# Patient Record
Sex: Male | Born: 1990 | Race: Black or African American | Hispanic: No | Marital: Single | Smoking: Never smoker
Health system: Southern US, Community
[De-identification: ages and names within clinical notes are randomized; demographics above are authoritative.]

## PROBLEM LIST (undated history)

## (undated) DIAGNOSIS — N289 Disorder of kidney and ureter, unspecified: Secondary | ICD-10-CM

---

## 2020-02-04 ENCOUNTER — Emergency Department (HOSPITAL_COMMUNITY)
Admission: EM | Admit: 2020-02-04 | Discharge: 2020-02-04 | Disposition: A | Discharge status: Home / Self Care | Attending: Emergency Medicine | Admitting: Emergency Medicine

## 2020-02-04 ENCOUNTER — Emergency Department (HOSPITAL_COMMUNITY)

## 2020-02-04 ENCOUNTER — Other Ambulatory Visit: Payer: Self-pay

## 2020-02-04 ENCOUNTER — Encounter (HOSPITAL_COMMUNITY): Payer: Self-pay

## 2020-02-04 DIAGNOSIS — R1013 Epigastric pain: Secondary | ICD-10-CM | POA: Insufficient documentation

## 2020-02-04 DIAGNOSIS — K8020 Calculus of gallbladder without cholecystitis without obstruction: Secondary | ICD-10-CM | POA: Diagnosis not present

## 2020-02-04 DIAGNOSIS — R079 Chest pain, unspecified: Secondary | ICD-10-CM | POA: Diagnosis present

## 2020-02-04 DIAGNOSIS — Z20822 Contact with and (suspected) exposure to covid-19: Secondary | ICD-10-CM | POA: Diagnosis not present

## 2020-02-04 DIAGNOSIS — R590 Localized enlarged lymph nodes: Secondary | ICD-10-CM | POA: Diagnosis not present

## 2020-02-04 HISTORY — DX: Disorder of kidney and ureter, unspecified: N28.9

## 2020-02-04 LAB — CBC WITH DIFFERENTIAL/PLATELET
Abs Immature Granulocytes: 0.01 10*3/uL (ref 0.00–0.07)
Basophils Absolute: 0.1 10*3/uL (ref 0.0–0.1)
Basophils Relative: 2 %
Eosinophils Absolute: 0.1 10*3/uL (ref 0.0–0.5)
Eosinophils Relative: 2 %
HCT: 54.7 % — ABNORMAL HIGH (ref 39.0–52.0)
Hemoglobin: 18.9 g/dL — ABNORMAL HIGH (ref 13.0–17.0)
Immature Granulocytes: 0 %
Lymphocytes Relative: 23 %
Lymphs Abs: 1.1 10*3/uL (ref 0.7–4.0)
MCH: 29.3 pg (ref 26.0–34.0)
MCHC: 34.6 g/dL (ref 30.0–36.0)
MCV: 84.9 fL (ref 80.0–100.0)
Monocytes Absolute: 0.8 10*3/uL (ref 0.1–1.0)
Monocytes Relative: 16 %
Neutro Abs: 2.7 10*3/uL (ref 1.7–7.7)
Neutrophils Relative %: 57 %
Platelets: 384 10*3/uL (ref 150–400)
RBC: 6.44 MIL/uL — ABNORMAL HIGH (ref 4.22–5.81)
RDW: 12.3 % (ref 11.5–15.5)
WBC: 4.7 10*3/uL (ref 4.0–10.5)
nRBC: 0 % (ref 0.0–0.2)

## 2020-02-04 LAB — URINALYSIS, ROUTINE W REFLEX MICROSCOPIC
Bacteria, UA: NONE SEEN
Glucose, UA: NEGATIVE mg/dL
Hgb urine dipstick: NEGATIVE
Ketones, ur: 20 mg/dL — AB
Leukocytes,Ua: NEGATIVE
Nitrite: NEGATIVE
Protein, ur: 30 mg/dL — AB
Specific Gravity, Urine: 1.029 (ref 1.005–1.030)
pH: 5 (ref 5.0–8.0)

## 2020-02-04 LAB — TROPONIN I (HIGH SENSITIVITY)
Troponin I (High Sensitivity): 5 ng/L (ref <18)
Troponin I (High Sensitivity): 7 ng/L (ref <18)

## 2020-02-04 LAB — COMPREHENSIVE METABOLIC PANEL
ALT: 21 U/L (ref 0–44)
AST: 24 U/L (ref 15–41)
Albumin: 4.8 g/dL (ref 3.5–5.0)
Alkaline Phosphatase: 71 U/L (ref 38–126)
Anion gap: 12 (ref 5–15)
BUN: 12 mg/dL (ref 6–20)
CO2: 23 mmol/L (ref 22–32)
Calcium: 9.7 mg/dL (ref 8.9–10.3)
Chloride: 101 mmol/L (ref 98–111)
Creatinine, Ser: 1.41 mg/dL — ABNORMAL HIGH (ref 0.61–1.24)
GFR calc Af Amer: >60 mL/min (ref >60)
GFR calc non Af Amer: >60 mL/min (ref >60)
Glucose, Bld: 119 mg/dL — ABNORMAL HIGH (ref 70–99)
Potassium: 3.5 mmol/L (ref 3.5–5.1)
Sodium: 136 mmol/L (ref 135–145)
Total Bilirubin: 2.8 mg/dL — ABNORMAL HIGH (ref 0.3–1.2)
Total Protein: 8.3 g/dL — ABNORMAL HIGH (ref 6.5–8.1)

## 2020-02-04 LAB — PROTIME-INR
INR: 1.1 (ref 0.8–1.2)
Prothrombin Time: 14.1 seconds (ref 11.4–15.2)

## 2020-02-04 LAB — APTT: aPTT: 28 seconds (ref 24–36)

## 2020-02-04 LAB — LACTIC ACID, PLASMA
Lactic Acid, Venous: 1.4 mmol/L (ref 0.5–1.9)
Lactic Acid, Venous: 1.1 mmol/L (ref 0.5–1.9)

## 2020-02-04 LAB — SARS CORONAVIRUS 2 BY RT PCR (HOSPITAL ORDER, PERFORMED IN ~~LOC~~ HOSPITAL LAB): SARS Coronavirus 2: NEGATIVE

## 2020-02-04 LAB — LIPASE, BLOOD: Lipase: 22 U/L (ref 11–51)

## 2020-02-04 MED ORDER — SODIUM CHLORIDE 0.9 % IV BOLUS
1000.0000 mL | Freq: Once | INTRAVENOUS | Status: AC
  Administered 2020-02-04: 1000 mL via INTRAVENOUS

## 2020-02-04 MED ORDER — ALUM & MAG HYDROXIDE-SIMETH 200-200-20 MG/5ML PO SUSP
30.0000 mL | Freq: Once | ORAL | Status: AC
  Administered 2020-02-04: 30 mL via ORAL
  Filled 2020-02-04: qty 30

## 2020-02-04 MED ORDER — SODIUM CHLORIDE 0.9 % IV SOLN
2.0000 g | Freq: Once | INTRAVENOUS | Status: AC
  Administered 2020-02-04: 2 g via INTRAVENOUS
  Filled 2020-02-04: qty 20

## 2020-02-04 MED ORDER — LIDOCAINE VISCOUS HCL 2 % MT SOLN
15.0000 mL | Freq: Once | OROMUCOSAL | Status: AC
  Administered 2020-02-04: 15 mL via ORAL
  Filled 2020-02-04: qty 15

## 2020-02-04 MED ORDER — SODIUM CHLORIDE 0.9% FLUSH
3.0000 mL | Freq: Once | INTRAVENOUS | Status: AC
  Administered 2020-02-04: 3 mL via INTRAVENOUS

## 2020-02-04 MED ORDER — METRONIDAZOLE IN NACL 5-0.79 MG/ML-% IV SOLN
500.0000 mg | Freq: Once | INTRAVENOUS | Status: AC
  Administered 2020-02-04: 500 mg via INTRAVENOUS
  Filled 2020-02-04: qty 100

## 2020-02-04 MED ORDER — IOHEXOL 300 MG/ML  SOLN
100.0000 mL | Freq: Once | INTRAMUSCULAR | Status: AC | PRN
  Administered 2020-02-04: 100 mL via INTRAVENOUS

## 2020-02-04 MED ORDER — ACETAMINOPHEN 325 MG PO TABS
650.0000 mg | ORAL_TABLET | Freq: Once | ORAL | Status: AC
  Administered 2020-02-04: 650 mg via ORAL
  Filled 2020-02-04: qty 2

## 2020-02-04 MED ORDER — ONDANSETRON 4 MG PO TBDP
4.0000 mg | ORAL_TABLET | Freq: Three times a day (TID) | ORAL | 0 refills | Status: DC | PRN
Start: 2020-02-04 — End: 2020-02-16

## 2020-02-04 MED ORDER — CIPROFLOXACIN HCL 500 MG PO TABS
500.0000 mg | ORAL_TABLET | Freq: Two times a day (BID) | ORAL | 0 refills | Status: AC
Start: 2020-02-04 — End: 2020-02-09

## 2020-02-04 NOTE — Discharge Instructions (Addendum)
Your ultrasound showed findings consistent with gallstones which is likely causing your pain - please follow up with Dr. Lovell Sheehan general surgeon outpatient in 1-2 weeks.   The CT scan did not show any other concerning findings in your abdomen - it did show some enlargement in the lymphnodes of your chest. It is recommended that you have a repeat CT scan of your chest in 3 months for further evaluation.   I have prescribed antibiotics to cover for an infection - please take as prescribed. I have also prescribed nausea medication to take as needed. Please follow BLAND diet for the next few days including bland toast, white rice, apple sauce, bananas, etc. Things that can irritate your gallbladder include greasy/fatty foods and spicy foods.   Return to the ED for any worsening symptoms

## 2020-02-04 NOTE — ED Notes (Signed)
PATIENT DRANK CUP OF OJ WITHOUT ANY DIFFICULTIES.

## 2020-02-04 NOTE — ED Triage Notes (Signed)
Pt was brought in from [facility] Pt reports epigastric pain at 7am. Described as a burning sensation. No radiation. Sinus tach on monitor. Given nitro x 1 .Descibed as knot and burning sensation. Pt reports that he cant keep food down and hard to swallow.

## 2020-02-04 NOTE — ED Notes (Signed)
Patient completed PO fluid challenge with water and no difficulties.

## 2020-02-04 NOTE — ED Provider Notes (Signed)
[facility] EMERGENCY DEPARTMENT Provider Note   CSN: [id number] Arrival date & time: 02/04/20  6433     History Chief Complaint  Patient presents with  . Chest Pain  . Abdominal Pain    Alexander Foley is a 29 y.o. male who presents to the ED today from [facility] with complaint of gradual onset, constant, sharp/burning, epigastric/substernal chest pain that began this morning around 7 AM. Pt also complains of multiple episodes of NBNB emesis.  Patient states that they went on a hunger strike last week due to not being given commissary privileges.  Did not try to eat anything until yesterday when they felt a "knot" in the epigastrium/substernal area that felt like they would have difficulty swallowing.  They were able to keep the food down however woke up this morning with the same knot feeling and then began vomiting.  Per triage report patient was found to be sinus tachycardia on the monitor and was given 1 nitro without relief.  Pt with no history of CAD.  No family history of CAD.  Patient is a never smoker.  Patient states that he felt subjective fevers earlier this morning, temperature was taken in the ED with reading of 100.2.  Facility is denying Covid outbreaks.  Patient denies any radiation of pain into his jaws, arms, back.  Patient reports mild shortness of breath with the pain. No hx DVT/PE. No recent prolonged travel or immobilization - pt has been at [employer] for 3.5 months now. No exogenous hormone use. No active malignancy. No hemoptysis.   The history is provided by the patient, the EMS personnel and medical records.       Past Medical History:  Diagnosis Date  . Renal disorder    kidney stones    There are no problems to display for this patient.   History reviewed. No pertinent surgical history.     No family history on file.  Social History   Tobacco Use  . Smoking status: Never Smoker  Substance Use Topics  . Alcohol use: Not Currently  .  Drug use: Not Currently    Home Medications Prior to Admission medications   Medication Sig Start Date End Date Taking? Authorizing Provider  ciprofloxacin (CIPRO) 500 MG tablet Take 1 tablet (500 mg total) by mouth 2 (two) times daily for 5 days. 02/04/20 02/09/20  Tanda Rockers, PA-C  ondansetron (ZOFRAN ODT) 4 MG disintegrating tablet Take 1 tablet (4 mg total) by mouth every 8 (eight) hours as needed for nausea or vomiting. 02/04/20   Tanda Rockers, PA-C    Allergies    [place] allergy]  Review of Systems   Review of Systems  Constitutional: Positive for fever. Negative for chills.  Respiratory: Positive for shortness of breath. Negative for cough.   Cardiovascular: Positive for chest pain. Negative for palpitations and leg swelling.  Gastrointestinal: Positive for abdominal pain, nausea and vomiting. Negative for constipation and diarrhea.  All other systems reviewed and are negative.   Physical Exam Updated Vital Signs BP (!) 139/92 (BP Location: Right Arm)   Pulse 100   Temp 100.2 F (37.9 C) (Oral)   Resp 18   Ht 5\' 9"  (1.753 m)   Wt 87.5 kg   SpO2 98%   BMI 28.50 kg/m   Physical Exam Vitals and nursing note reviewed.  Constitutional:      Appearance: He is not ill-appearing or diaphoretic.  HENT:     Head: Normocephalic and atraumatic.  Eyes:     Conjunctiva/sclera: Conjunctivae normal.  Cardiovascular:     Rate and Rhythm: Regular rhythm. Tachycardia present.     Pulses:          Radial pulses are 2+ on the right side and 2+ on the left side.       Dorsalis pedis pulses are 2+ on the right side and 2+ on the left side.     Heart sounds: Normal heart sounds.  Pulmonary:     Effort: Pulmonary effort is normal.     Breath sounds: Normal breath sounds. No decreased breath sounds, wheezing, rhonchi or rales.  Chest:     Chest wall: Tenderness present.  Abdominal:     Palpations: Abdomen is soft.     Tenderness: There is abdominal tenderness. There is  no guarding or rebound.     Comments: Soft, + RUQ/LUQ/epigastric abdominal TTP, +BS throughout, no r/g/r, neg murphy's, neg mcburney's, no CVA TTP  Musculoskeletal:     Cervical back: Neck supple.     Right lower leg: No edema.     Left lower leg: No edema.  Skin:    General: Skin is warm and dry.  Neurological:     Mental Status: He is alert.     ED Results / Procedures / Treatments   Labs (all labs ordered are listed, but only abnormal results are displayed) Labs Reviewed  COMPREHENSIVE METABOLIC PANEL - Abnormal; Notable for the following components:      Result Value   Glucose, Bld 119 (*)    Creatinine, Ser 1.41 (*)    Total Protein 8.3 (*)    Total Bilirubin 2.8 (*)    All other components within normal limits  CBC WITH DIFFERENTIAL/PLATELET - Abnormal; Notable for the following components:   RBC 6.44 (*)    Hemoglobin 18.9 (*)    HCT 54.7 (*)    All other components within normal limits  URINALYSIS, ROUTINE W REFLEX MICROSCOPIC - Abnormal; Notable for the following components:   Color, Urine AMBER (*)    Bilirubin Urine SMALL (*)    Ketones, ur 20 (*)    Protein, ur 30 (*)    All other components within normal limits  SARS CORONAVIRUS 2 BY RT PCR (HOSPITAL ORDER, PERFORMED IN [facility])  CULTURE, BLOOD (ROUTINE X 2)  CULTURE, BLOOD (ROUTINE X 2)  URINE CULTURE  LACTIC ACID, PLASMA  LACTIC ACID, PLASMA  APTT  PROTIME-INR  LIPASE, BLOOD  TROPONIN I (HIGH SENSITIVITY)  TROPONIN I (HIGH SENSITIVITY)    EKG EKG Interpretation  Date/Time:  Monday February 04 2020 09:35:51 EDT Ventricular Rate:  94 PR Interval:    QRS Duration: 87 QT Interval:  396 QTC Calculation: 496 R Axis:   169 Text Interpretation: Sinus rhythm Right atrial enlargement Probable RVH w/ secondary repol abnormality Nonspecific T abnormalities, lateral leads Prolonged QT interval No STEMI Confirmed by Alona Bene [phone]) on 02/04/2020 9:56:39 AM   Radiology DG Chest 2  View  Result Date: 02/04/2020 CLINICAL DATA:  Onset epigastric pain and burning at 7 a.m. today. EXAM: CHEST - 2 VIEW COMPARISON:  None. FINDINGS: The heart size and mediastinal contours are within normal limits. The lungs are clear. The visualized skeletal structures are unremarkable. IMPRESSION: Negative chest. Electronically Signed   By: Drusilla Kanner M.D.   On: 02/04/2020 10:46   CT Abdomen Pelvis W Contrast  Result Date: 02/04/2020 CLINICAL DATA:  RIGHT up quadrant pain, nondiagnostic ultrasound EXAM: CT ABDOMEN AND  PELVIS WITH CONTRAST TECHNIQUE: Multidetector CT imaging of the abdomen and pelvis was performed using the standard protocol following bolus administration of intravenous contrast. CONTRAST:  OMNIPAQUE IOHEXOL 300 MG/ML  SOLN COMPARISON:  Ultrasound acquired of the same date. FINDINGS: Lower chest: Incidental imaging of the lung bases with basilar atelectasis. No consolidation. No pleural effusion. Incidental 15 mm RIGHT hilar lymph node measured in short axis. Hepatobiliary: No focal, suspicious hepatic lesion. Small low-density lesions, 1 in the medial segment of the LEFT hepatic lobe 5 mm likely a small cyst, similar lesion in the posterior RIGHT hepatic lobe. Gallbladder is mildly distended without evidence of wall thickening or pericholecystic fluid. No biliary duct dilation. Portal vein is patent in the liver. Pancreas: Pancreas normal without focal lesion or ductal dilation. No peripancreatic inflammation. Spleen: Spleen normal size without focal lesion. Adrenals/Urinary Tract: Adrenal glands are normal. Kidneys enhance symmetrically.  No sign of hydronephrosis. Stomach/Bowel: . No small bowel dilation. Appendix is normal. Colon normal in course and caliber. Vascular/Lymphatic: No aortic atherosclerosis. No aneurysmal dilation of the abdominal aorta. Reproductive: No pelvic adenopathy. Prostate unremarkable by CT on limited assessment. Other: Small fat containing umbilical  hernia. Musculoskeletal: No acute bone finding. No destructive bone process. IMPRESSION: 1. No acute findings in the abdomen or pelvis. 2. Small fat containing umbilical hernia. 3. RIGHT hilar lymph node with mild enlargement, nonspecific measuring approximately 15 mm short axis, chest is incompletely imaged. Consider short interval follow-up chest CT within 3 months to assess for stability and or other sites of nodal enlargement Electronically Signed   By: Donzetta Kohut M.D.   On: 02/04/2020 14:13   US Abdomen Limited RUQ  Result Date: 02/04/2020 CLINICAL DATA:  Epigastric pain for 1 day EXAM: ULTRASOUND ABDOMEN LIMITED RIGHT UPPER QUADRANT COMPARISON:  Renal sonogram of 11/29/2019 FINDINGS: Gallbladder: Sludge in the gallbladder. Small calculi are noted, largest 4 mm. No reported tenderness over the gallbladder. No wall thickening or pericholecystic fluid. Common bile duct: Diameter: 2.2 mm, limited assessment due to body habitus. Liver: No focal lesion identified. Within normal limits in parenchymal echogenicity. Portal vein is patent on color Doppler imaging with normal direction of blood flow towards the liver. Other: None. IMPRESSION: Sludge with small biliary calculus in the gallbladder lumen, no sonographic signs of acute cholecystitis. Electronically Signed   By: Donzetta Kohut M.D.   On: 02/04/2020 11:41    Procedures Procedures (including critical care time)  Medications Ordered in ED Medications  sodium chloride flush (NS) 0.9 % injection 3 mL (3 mLs Intravenous Given 02/04/20 1032)  acetaminophen (TYLENOL) tablet 650 mg (650 mg Oral Given 02/04/20 1032)  sodium chloride 0.9 % bolus 1,000 mL (0 mLs Intravenous Stopped 02/04/20 1158)  cefTRIAXone (ROCEPHIN) 2 g in sodium chloride 0.9 % 100 mL IVPB (0 g Intravenous Stopped 02/04/20 1128)  metroNIDAZOLE (FLAGYL) IVPB 500 mg (0 mg Intravenous Stopped 02/04/20 1319)  alum & mag hydroxide-simeth (MAALOX/MYLANTA) 200-200-20 MG/5ML suspension 30 mL  (30 mLs Oral Given 02/04/20 1328)    And  lidocaine (XYLOCAINE) 2 % viscous mouth solution 15 mL (15 mLs Oral Given 02/04/20 1328)  iohexol (OMNIPAQUE) 300 MG/ML solution 100 mL (100 mLs Intravenous Contrast Given 02/04/20 1340)    ED Course  I have reviewed the triage vital signs and the nursing notes.  Pertinent labs & imaging results that were available during my care of the patient were reviewed by me and considered in my medical decision making (see chart for details).  Clinical Course as  of Feb 04 1535  Mon Feb 04, 2020  1433 Discussed case with Dr. Arnoldo Morale general surgery - recommends Cipro x 5 days and outpatient follow up   [MV]    Clinical Course User Index [MV] Eustaquio Maize, PA-C   MDM Rules/Calculators/A&P                          29 year old male who presents to the ED from [place] with complaint of epigastric/chest pain that began around 7 AM this morning with associated nausea and nonbloody nonbilious emesis.  On arrival to the ED patient is noted to be febrile 100.2.  Reports subjective fevers this morning however was unable to take temperature.  Patient also noted to be mildly tachycardic in the low 100s.  Nontachypneic.  Satting 98% on room air however does state mild shortness of breath.  On exam patient has diffuse upper abdominal tenderness palpation as well as substernal tenderness to palpation.  No lower abdominal tenderness, no rebound or guarding.  No previous abdominal surgeries.  No risk factors for PE.  No family history of CAD or personal history.  Patient is a never smoker.  Given temperature and tachycardia will work-up for sepsis at this time, I suspect source of infection is likely intra-abdominal related given emesis.  Will provide Rocephin and Flagyl empirically.  Will provide liter fluid bolus pending lactic acid.  Will test for Covid even patient lives in congregate setting.   CBC without signs of leukocytosis; wbc at 4.7. Hgb and Hct  18.9/54.7; does appear hemoconcentrated. Fluids running.  CMP with creatinine 1.41 and T bili 2.8. No other LFT abnormalities. In the setting of abdominal pain and elevated T bili RUQ ultrasound has been ordered.  Lipase 22.  Lactic acid 1.1  CXR clear at this time EKG with prolonged Qt and nonspecific T wave abnormalities. Have held on zofran at this time given prolonged QT; pt not actively vomiting in the ED today Troponin of 5; will repeat  RUQ ultrasound with gallstone as well as sludge, likely contributor of pt's symptoms. No sonographic evidence of acute cholecystitis and it does not appear that the labs reflect this either.  COVID test negative and U/A clear of infection - ?if sludge would cause elevated temp. Discussed case with attending physician Dr. Laverta Baltimore who has evaluated patient - will obtain CT A/P for further evaluation given mild elevation in temp today. Will plan to discuss with general surgery afterwards.   CT scan without any acute abdominal findings. Does show R hilar lymph node with recommendations for outpatient CT scan in 3 months. Pt fluid challenged - able to tolerate water without difficulty.   Dr. Arnoldo Morale general surgeon recommends outpatient follow up. Does recommend placing on 500 mg Cipro BID x 5 days to cover for any type of infection. Have discharged with this as well as zofran PRN. Pt instructed to follow up outpatient with Dr. Arnoldo Morale in 1-2 weeks. He is in agreement with plan and stable for discharge home.   This note was prepared using Dragon voice recognition software and may include unintentional dictation errors due to the inherent limitations of voice recognition software.  Final Clinical Impression(s) / ED Diagnoses Final diagnoses:  Epigastric pain  Calculus of gallbladder without cholecystitis without obstruction  Hilar lymphadenopathy    Rx / DC Orders ED Discharge Orders         Ordered    ciprofloxacin (CIPRO) 500 MG tablet  2 times daily      Discontinue  Reprint     02/04/20 1534    ondansetron (ZOFRAN ODT) 4 MG disintegrating tablet  Every 8 hours PRN     Discontinue  Reprint     02/04/20 1534           Discharge Instructions     Your ultrasound showed findings consistent with gallstones which is likely causing your pain - please follow up with Dr. Lovell SheehanJenkins general surgeon outpatient in 1-2 weeks.   The CT scan did not show any other concerning findings in your abdomen - it did show some enlargement in the lymphnodes of your chest. It is recommended that you have a repeat CT scan of your chest in 3 months for further evaluation.   I have prescribed antibiotics to cover for an infection - please take as prescribed. I have also prescribed nausea medication to take as needed. Please follow BLAND diet for the next few days including bland toast, white rice, apple sauce, bananas, etc. Things that can irritate your gallbladder include greasy/fatty foods and spicy foods.   Return to the ED for any worsening symptoms       Tanda RockersVenter, Amour Cutrone, Cordelia Poche-C 02/04/20 1537    Long, Arlyss RepressJoshua G, MD 02/05/20 1223

## 2020-02-05 LAB — URINE CULTURE: Culture: NO GROWTH

## 2020-02-09 LAB — CULTURE, BLOOD (ROUTINE X 2)
Culture: NO GROWTH
Culture: NO GROWTH

## 2020-02-13 ENCOUNTER — Other Ambulatory Visit: Payer: Self-pay

## 2020-02-13 ENCOUNTER — Inpatient Hospital Stay (HOSPITAL_COMMUNITY)
Admission: EM | Admit: 2020-02-13 | Discharge: 2020-02-16 | DRG: 419 | Disposition: A | Discharge status: Home / Self Care | Attending: General Surgery | Admitting: General Surgery

## 2020-02-13 ENCOUNTER — Encounter (HOSPITAL_COMMUNITY): Payer: Self-pay

## 2020-02-13 DIAGNOSIS — K8050 Calculus of bile duct without cholangitis or cholecystitis without obstruction: Secondary | ICD-10-CM | POA: Diagnosis not present

## 2020-02-13 DIAGNOSIS — K8010 Calculus of gallbladder with chronic cholecystitis without obstruction: Secondary | ICD-10-CM | POA: Diagnosis not present

## 2020-02-13 DIAGNOSIS — Z87442 Personal history of urinary calculi: Secondary | ICD-10-CM

## 2020-02-13 DIAGNOSIS — Z20822 Contact with and (suspected) exposure to covid-19: Secondary | ICD-10-CM | POA: Diagnosis present

## 2020-02-13 DIAGNOSIS — F419 Anxiety disorder, unspecified: Secondary | ICD-10-CM | POA: Diagnosis present

## 2020-02-13 DIAGNOSIS — K8020 Calculus of gallbladder without cholecystitis without obstruction: Secondary | ICD-10-CM

## 2020-02-13 DIAGNOSIS — R112 Nausea with vomiting, unspecified: Secondary | ICD-10-CM | POA: Diagnosis present

## 2020-02-13 DIAGNOSIS — R1011 Right upper quadrant pain: Secondary | ICD-10-CM

## 2020-02-13 NOTE — ED Triage Notes (Signed)
[facility] with complaints of abdominal pain. States that its his gallbladder. He says he needs it taken out.

## 2020-02-14 ENCOUNTER — Encounter (HOSPITAL_COMMUNITY): Payer: Self-pay | Admitting: Internal Medicine

## 2020-02-14 ENCOUNTER — Inpatient Hospital Stay (HOSPITAL_COMMUNITY): Admitting: Anesthesiology

## 2020-02-14 ENCOUNTER — Ambulatory Visit: Payer: Self-pay | Admitting: General Surgery

## 2020-02-14 ENCOUNTER — Inpatient Hospital Stay (HOSPITAL_COMMUNITY)

## 2020-02-14 ENCOUNTER — Encounter (HOSPITAL_COMMUNITY)
Admission: EM | Disposition: A | Discharge status: Home / Self Care | Payer: Self-pay | Source: Home / Self Care | Attending: General Surgery

## 2020-02-14 DIAGNOSIS — K8050 Calculus of bile duct without cholangitis or cholecystitis without obstruction: Secondary | ICD-10-CM | POA: Diagnosis present

## 2020-02-14 DIAGNOSIS — Z20822 Contact with and (suspected) exposure to covid-19: Secondary | ICD-10-CM | POA: Diagnosis present

## 2020-02-14 DIAGNOSIS — Z87442 Personal history of urinary calculi: Secondary | ICD-10-CM | POA: Diagnosis not present

## 2020-02-14 DIAGNOSIS — R112 Nausea with vomiting, unspecified: Secondary | ICD-10-CM | POA: Diagnosis not present

## 2020-02-14 DIAGNOSIS — K8010 Calculus of gallbladder with chronic cholecystitis without obstruction: Secondary | ICD-10-CM | POA: Diagnosis present

## 2020-02-14 DIAGNOSIS — F419 Anxiety disorder, unspecified: Secondary | ICD-10-CM | POA: Diagnosis present

## 2020-02-14 HISTORY — PX: CHOLECYSTECTOMY: SHX55

## 2020-02-14 LAB — COMPREHENSIVE METABOLIC PANEL
ALT: 22 U/L (ref 0–44)
ALT: 20 U/L (ref 0–44)
AST: 26 U/L (ref 15–41)
AST: 23 U/L (ref 15–41)
Albumin: 4.4 g/dL (ref 3.5–5.0)
Albumin: 3.8 g/dL (ref 3.5–5.0)
Alkaline Phosphatase: 55 U/L (ref 38–126)
Alkaline Phosphatase: 48 U/L (ref 38–126)
Anion gap: 8 (ref 5–15)
Anion gap: 10 (ref 5–15)
BUN: 9 mg/dL (ref 6–20)
BUN: 8 mg/dL (ref 6–20)
CO2: 25 mmol/L (ref 22–32)
CO2: 26 mmol/L (ref 22–32)
Calcium: 9.1 mg/dL (ref 8.9–10.3)
Calcium: 8.8 mg/dL — ABNORMAL LOW (ref 8.9–10.3)
Chloride: 104 mmol/L (ref 98–111)
Chloride: 103 mmol/L (ref 98–111)
Creatinine, Ser: 1.3 mg/dL — ABNORMAL HIGH (ref 0.61–1.24)
Creatinine, Ser: 1.31 mg/dL — ABNORMAL HIGH (ref 0.61–1.24)
GFR calc Af Amer: >60 mL/min (ref >60)
GFR calc Af Amer: >60 mL/min (ref >60)
GFR calc non Af Amer: >60 mL/min (ref >60)
GFR calc non Af Amer: >60 mL/min (ref >60)
Glucose, Bld: 96 mg/dL (ref 70–99)
Glucose, Bld: 91 mg/dL (ref 70–99)
Potassium: 3.4 mmol/L — ABNORMAL LOW (ref 3.5–5.1)
Potassium: 3.6 mmol/L (ref 3.5–5.1)
Sodium: 137 mmol/L (ref 135–145)
Sodium: 139 mmol/L (ref 135–145)
Total Bilirubin: 0.9 mg/dL (ref 0.3–1.2)
Total Bilirubin: 0.7 mg/dL (ref 0.3–1.2)
Total Protein: 7.3 g/dL (ref 6.5–8.1)
Total Protein: 6.5 g/dL (ref 6.5–8.1)

## 2020-02-14 LAB — CBC WITH DIFFERENTIAL/PLATELET
Abs Immature Granulocytes: 0.01 10*3/uL (ref 0.00–0.07)
Basophils Absolute: 0.1 10*3/uL (ref 0.0–0.1)
Basophils Relative: 1 %
Eosinophils Absolute: 0.2 10*3/uL (ref 0.0–0.5)
Eosinophils Relative: 4 %
HCT: 46.2 % (ref 39.0–52.0)
Hemoglobin: 15.7 g/dL (ref 13.0–17.0)
Immature Granulocytes: 0 %
Lymphocytes Relative: 30 %
Lymphs Abs: 1.3 10*3/uL (ref 0.7–4.0)
MCH: 29.1 pg (ref 26.0–34.0)
MCHC: 34 g/dL (ref 30.0–36.0)
MCV: 85.7 fL (ref 80.0–100.0)
Monocytes Absolute: 0.8 10*3/uL (ref 0.1–1.0)
Monocytes Relative: 18 %
Neutro Abs: 2.1 10*3/uL (ref 1.7–7.7)
Neutrophils Relative %: 47 %
Platelets: 370 10*3/uL (ref 150–400)
RBC: 5.39 MIL/uL (ref 4.22–5.81)
RDW: 12.1 % (ref 11.5–15.5)
WBC: 4.5 10*3/uL (ref 4.0–10.5)
nRBC: 0 % (ref 0.0–0.2)

## 2020-02-14 LAB — HIV ANTIBODY (ROUTINE TESTING W REFLEX): HIV Screen 4th Generation wRfx: NONREACTIVE

## 2020-02-14 LAB — CBC
HCT: 42.7 % (ref 39.0–52.0)
Hemoglobin: 14.3 g/dL (ref 13.0–17.0)
MCH: 29.1 pg (ref 26.0–34.0)
MCHC: 33.5 g/dL (ref 30.0–36.0)
MCV: 87 fL (ref 80.0–100.0)
Platelets: 324 10*3/uL (ref 150–400)
RBC: 4.91 MIL/uL (ref 4.22–5.81)
RDW: 12.2 % (ref 11.5–15.5)
WBC: 3.9 10*3/uL — ABNORMAL LOW (ref 4.0–10.5)
nRBC: 0 % (ref 0.0–0.2)

## 2020-02-14 LAB — SARS CORONAVIRUS 2 BY RT PCR (HOSPITAL ORDER, PERFORMED IN ~~LOC~~ HOSPITAL LAB): SARS Coronavirus 2: NEGATIVE

## 2020-02-14 LAB — LIPASE, BLOOD: Lipase: 25 U/L (ref 11–51)

## 2020-02-14 LAB — MRSA PCR SCREENING: MRSA by PCR: NEGATIVE

## 2020-02-14 SURGERY — LAPAROSCOPIC CHOLECYSTECTOMY
Anesthesia: General

## 2020-02-14 MED ORDER — IBUPROFEN 800 MG PO TABS
800.0000 mg | ORAL_TABLET | Freq: Four times a day (QID) | ORAL | Status: DC
  Administered 2020-02-15 – 2020-02-16 (×5): 800 mg via ORAL
  Filled 2020-02-14 (×8): qty 1

## 2020-02-14 MED ORDER — ROCURONIUM BROMIDE 100 MG/10ML IV SOLN
INTRAVENOUS | Status: DC | PRN
  Administered 2020-02-14: 30 mg via INTRAVENOUS

## 2020-02-14 MED ORDER — OXYCODONE HCL 5 MG PO TABS
5.0000 mg | ORAL_TABLET | ORAL | Status: DC | PRN
  Administered 2020-02-14 – 2020-02-15 (×3): 5 mg via ORAL
  Filled 2020-02-14 (×5): qty 1

## 2020-02-14 MED ORDER — ORAL CARE MOUTH RINSE: 15.0000 mL | Freq: Once | OROMUCOSAL | Status: AC

## 2020-02-14 MED ORDER — SODIUM CHLORIDE 0.9 % IV SOLN: 2.0000 g | INTRAVENOUS | Status: DC

## 2020-02-14 MED ORDER — LIDOCAINE HCL (CARDIAC) PF 50 MG/5ML IV SOSY
PREFILLED_SYRINGE | INTRAVENOUS | Status: DC | PRN
  Administered 2020-02-14: 100 mg via INTRAVENOUS

## 2020-02-14 MED ORDER — MIDAZOLAM HCL 2 MG/2ML IJ SOLN
INTRAMUSCULAR | Status: DC | PRN
  Administered 2020-02-14: 2 mg via INTRAVENOUS

## 2020-02-14 MED ORDER — LORAZEPAM 2 MG/ML IJ SOLN: 1.0000 mg | Freq: Four times a day (QID) | INTRAMUSCULAR | Status: DC | PRN

## 2020-02-14 MED ORDER — LACTATED RINGERS IV SOLN: INTRAVENOUS | Status: DC

## 2020-02-14 MED ORDER — CHLORHEXIDINE GLUCONATE 0.12 % MT SOLN
OROMUCOSAL | Status: AC
  Filled 2020-02-14: qty 15

## 2020-02-14 MED ORDER — SODIUM CHLORIDE 0.9 % IV SOLN
INTRAVENOUS | Status: AC
  Filled 2020-02-14: qty 2

## 2020-02-14 MED ORDER — SODIUM CHLORIDE 0.9 % IV SOLN
2.0000 g | INTRAVENOUS | Status: AC
  Administered 2020-02-14: 2 g via INTRAVENOUS
  Filled 2020-02-14: qty 2

## 2020-02-14 MED ORDER — SODIUM CHLORIDE 0.9 % IR SOLN
Status: DC | PRN
  Administered 2020-02-14: 1000 mL

## 2020-02-14 MED ORDER — HEPARIN SODIUM (PORCINE) 5000 UNIT/ML IJ SOLN
5000.0000 [IU] | Freq: Three times a day (TID) | INTRAMUSCULAR | Status: DC
  Administered 2020-02-14 – 2020-02-16 (×5): 5000 [IU] via SUBCUTANEOUS
  Filled 2020-02-14 (×7): qty 1

## 2020-02-14 MED ORDER — ONDANSETRON HCL 4 MG/2ML IJ SOLN: 4.0000 mg | Freq: Once | INTRAMUSCULAR | Status: DC | PRN

## 2020-02-14 MED ORDER — MORPHINE SULFATE (PF) 2 MG/ML IV SOLN
2.0000 mg | INTRAVENOUS | Status: DC | PRN
  Administered 2020-02-14 – 2020-02-16 (×2): 2 mg via INTRAVENOUS
  Filled 2020-02-14 (×2): qty 1

## 2020-02-14 MED ORDER — ACETAMINOPHEN 650 MG RE SUPP: 650.0000 mg | Freq: Four times a day (QID) | RECTAL | Status: DC | PRN

## 2020-02-14 MED ORDER — ONDANSETRON HCL 4 MG/2ML IJ SOLN
INTRAMUSCULAR | Status: DC | PRN
  Administered 2020-02-14: 4 mg via INTRAVENOUS

## 2020-02-14 MED ORDER — BUPIVACAINE HCL (PF) 0.5 % IJ SOLN
INTRAMUSCULAR | Status: AC
  Filled 2020-02-14: qty 30

## 2020-02-14 MED ORDER — SUGAMMADEX SODIUM 500 MG/5ML IV SOLN
INTRAVENOUS | Status: DC | PRN
  Administered 2020-02-14: 200 mg via INTRAVENOUS

## 2020-02-14 MED ORDER — ACETAMINOPHEN 325 MG PO TABS: 650.0000 mg | ORAL_TABLET | Freq: Four times a day (QID) | ORAL | Status: DC | PRN

## 2020-02-14 MED ORDER — ONDANSETRON HCL 4 MG/2ML IJ SOLN
4.0000 mg | Freq: Once | INTRAMUSCULAR | Status: AC
  Administered 2020-02-14: 4 mg via INTRAVENOUS
  Filled 2020-02-14: qty 2

## 2020-02-14 MED ORDER — ROCURONIUM BROMIDE 10 MG/ML (PF) SYRINGE
PREFILLED_SYRINGE | INTRAVENOUS | Status: AC
  Filled 2020-02-14: qty 10

## 2020-02-14 MED ORDER — LIDOCAINE 2% (20 MG/ML) 5 ML SYRINGE
INTRAMUSCULAR | Status: AC
  Filled 2020-02-14: qty 15

## 2020-02-14 MED ORDER — SODIUM CHLORIDE 0.9 % IV SOLN: INTRAVENOUS | Status: DC

## 2020-02-14 MED ORDER — HYDROCODONE-ACETAMINOPHEN 5-325 MG PO TABS
1.0000 | ORAL_TABLET | ORAL | Status: DC | PRN
  Administered 2020-02-14: 2 via ORAL
  Filled 2020-02-14: qty 2

## 2020-02-14 MED ORDER — ONDANSETRON HCL 4 MG/2ML IJ SOLN
4.0000 mg | Freq: Four times a day (QID) | INTRAMUSCULAR | Status: DC | PRN
  Administered 2020-02-14 – 2020-02-16 (×3): 4 mg via INTRAVENOUS
  Filled 2020-02-14 (×3): qty 2

## 2020-02-14 MED ORDER — ACETAMINOPHEN 500 MG PO TABS
1000.0000 mg | ORAL_TABLET | Freq: Four times a day (QID) | ORAL | Status: DC
  Administered 2020-02-14 – 2020-02-16 (×6): 1000 mg via ORAL
  Filled 2020-02-14 (×7): qty 2

## 2020-02-14 MED ORDER — CHLORHEXIDINE GLUCONATE 0.12 % MT SOLN
15.0000 mL | Freq: Once | OROMUCOSAL | Status: AC
  Administered 2020-02-14: 15 mL via OROMUCOSAL

## 2020-02-14 MED ORDER — CHLORHEXIDINE GLUCONATE CLOTH 2 % EX PADS: 6.0000 | MEDICATED_PAD | Freq: Once | CUTANEOUS | Status: DC

## 2020-02-14 MED ORDER — LORAZEPAM 2 MG/ML IJ SOLN
1.0000 mg | Freq: Once | INTRAMUSCULAR | Status: AC
  Administered 2020-02-14: 1 mg via INTRAVENOUS
  Filled 2020-02-14: qty 1

## 2020-02-14 MED ORDER — PROPOFOL 10 MG/ML IV BOLUS
INTRAVENOUS | Status: AC
  Filled 2020-02-14: qty 20

## 2020-02-14 MED ORDER — PANTOPRAZOLE SODIUM 40 MG IV SOLR
40.0000 mg | Freq: Once | INTRAVENOUS | Status: AC
  Administered 2020-02-14: 40 mg via INTRAVENOUS
  Filled 2020-02-14: qty 40

## 2020-02-14 MED ORDER — HYDROMORPHONE HCL 1 MG/ML IJ SOLN
INTRAMUSCULAR | Status: AC
  Filled 2020-02-14: qty 1

## 2020-02-14 MED ORDER — MIDAZOLAM HCL 2 MG/2ML IJ SOLN
INTRAMUSCULAR | Status: AC
  Filled 2020-02-14: qty 2

## 2020-02-14 MED ORDER — SEVOFLURANE IN SOLN
RESPIRATORY_TRACT | Status: AC
  Filled 2020-02-14: qty 500

## 2020-02-14 MED ORDER — KETOROLAC TROMETHAMINE 30 MG/ML IJ SOLN
30.0000 mg | Freq: Once | INTRAMUSCULAR | Status: AC
  Administered 2020-02-14: 30 mg via INTRAVENOUS
  Filled 2020-02-14: qty 1

## 2020-02-14 MED ORDER — FENTANYL CITRATE (PF) 100 MCG/2ML IJ SOLN
INTRAMUSCULAR | Status: DC | PRN
  Administered 2020-02-14 (×2): 50 ug via INTRAVENOUS
  Administered 2020-02-14: 100 ug via INTRAVENOUS
  Administered 2020-02-14: 50 ug via INTRAVENOUS

## 2020-02-14 MED ORDER — SODIUM CHLORIDE 0.9 % IV SOLN
2.0000 g | Freq: Once | INTRAVENOUS | Status: AC
  Administered 2020-02-14: 2 g via INTRAVENOUS
  Filled 2020-02-14: qty 20

## 2020-02-14 MED ORDER — HEMOSTATIC AGENTS (NO CHARGE) OPTIME
TOPICAL | Status: DC | PRN
  Administered 2020-02-14: 1 via TOPICAL

## 2020-02-14 MED ORDER — ONDANSETRON HCL 4 MG PO TABS: 4.0000 mg | ORAL_TABLET | Freq: Four times a day (QID) | ORAL | Status: DC | PRN

## 2020-02-14 MED ORDER — SODIUM CHLORIDE 0.9 % IV BOLUS (SEPSIS)
1000.0000 mL | Freq: Once | INTRAVENOUS | Status: AC
  Administered 2020-02-14: 1000 mL via INTRAVENOUS

## 2020-02-14 MED ORDER — HYDROMORPHONE HCL 1 MG/ML IJ SOLN
0.2500 mg | INTRAMUSCULAR | Status: DC | PRN
  Administered 2020-02-14 (×4): 0.5 mg via INTRAVENOUS
  Filled 2020-02-14 (×2): qty 0.5

## 2020-02-14 MED ORDER — SUCCINYLCHOLINE CHLORIDE 20 MG/ML IJ SOLN
INTRAMUSCULAR | Status: DC | PRN
  Administered 2020-02-14: 120 mg via INTRAVENOUS

## 2020-02-14 MED ORDER — PROPOFOL 10 MG/ML IV BOLUS
INTRAVENOUS | Status: DC | PRN
  Administered 2020-02-14: 200 mg via INTRAVENOUS

## 2020-02-14 MED ORDER — BUPIVACAINE HCL (PF) 0.5 % IJ SOLN
INTRAMUSCULAR | Status: DC | PRN
  Administered 2020-02-14: 20 mL

## 2020-02-14 MED ORDER — FENTANYL CITRATE (PF) 250 MCG/5ML IJ SOLN
INTRAMUSCULAR | Status: AC
  Filled 2020-02-14: qty 5

## 2020-02-14 SURGICAL SUPPLY — 43 items
APPLIER CLIP ROT 10 11.4 M/L (STAPLE) ×3
BAG RETRIEVAL 10 (BASKET) ×1
BAG RETRIEVAL 10MM (BASKET) ×1
BLADE SURG 15 STRL LF DISP TIS (BLADE) ×1 IMPLANT
BLADE SURG 15 STRL SS (BLADE) ×2
CHLORAPREP W/TINT 26 (MISCELLANEOUS) ×3 IMPLANT
CLIP APPLIE ROT 10 11.4 M/L (STAPLE) ×1 IMPLANT
CLOTH BEACON ORANGE TIMEOUT ST (SAFETY) ×3 IMPLANT
COVER LIGHT HANDLE STERIS (MISCELLANEOUS) ×6 IMPLANT
COVER WAND RF STERILE (DRAPES) ×3 IMPLANT
DECANTER SPIKE VIAL GLASS SM (MISCELLANEOUS) ×3 IMPLANT
DERMABOND ADVANCED (GAUZE/BANDAGES/DRESSINGS) ×2
DERMABOND ADVANCED .7 DNX12 (GAUZE/BANDAGES/DRESSINGS) ×1 IMPLANT
ELECT REM PT RETURN 9FT ADLT (ELECTROSURGICAL) ×3
ELECTRODE REM PT RTRN 9FT ADLT (ELECTROSURGICAL) ×1 IMPLANT
GLOVE BIO SURGEON STRL SZ 6.5 (GLOVE) ×2 IMPLANT
GLOVE BIO SURGEONS STRL SZ 6.5 (GLOVE) ×1
GLOVE BIOGEL PI IND STRL 6.5 (GLOVE) ×1 IMPLANT
GLOVE BIOGEL PI IND STRL 7.0 (GLOVE) ×3 IMPLANT
GLOVE BIOGEL PI INDICATOR 6.5 (GLOVE) ×2
GLOVE BIOGEL PI INDICATOR 7.0 (GLOVE) ×6
GOWN STRL REUS W/TWL LRG LVL3 (GOWN DISPOSABLE) ×9 IMPLANT
HEMOSTAT SNOW SURGICEL 2X4 (HEMOSTASIS) ×3 IMPLANT
INST SET LAPROSCOPIC AP (KITS) ×3 IMPLANT
KIT TURNOVER KIT A (KITS) ×3 IMPLANT
MANIFOLD NEPTUNE II (INSTRUMENTS) ×3 IMPLANT
NEEDLE INSUFFLATION 14GA 120MM (NEEDLE) ×3 IMPLANT
NS IRRIG 1000ML POUR BTL (IV SOLUTION) ×3 IMPLANT
PACK LAP CHOLE LZT030E (CUSTOM PROCEDURE TRAY) ×3 IMPLANT
PAD ARMBOARD 7.5X6 YLW CONV (MISCELLANEOUS) ×3 IMPLANT
SET BASIN LINEN APH (SET/KITS/TRAYS/PACK) ×3 IMPLANT
SET TUBE SMOKE EVAC HIGH FLOW (TUBING) ×3 IMPLANT
SLEEVE ENDOPATH XCEL 5M (ENDOMECHANICALS) ×3 IMPLANT
SUT MNCRL AB 4-0 PS2 18 (SUTURE) ×6 IMPLANT
SUT VICRYL 0 UR6 27IN ABS (SUTURE) ×3 IMPLANT
SYS BAG RETRIEVAL 10MM (BASKET) ×1
SYSTEM BAG RETRIEVAL 10MM (BASKET) ×1 IMPLANT
TROCAR ENDO BLADELESS 11MM (ENDOMECHANICALS) ×3 IMPLANT
TROCAR XCEL NON-BLD 5MMX100MML (ENDOMECHANICALS) ×3 IMPLANT
TROCAR XCEL UNIV SLVE 11M 100M (ENDOMECHANICALS) ×3 IMPLANT
TUBE CONNECTING 12'X1/4 (SUCTIONS) ×1
TUBE CONNECTING 12X1/4 (SUCTIONS) ×2 IMPLANT
WARMER LAPAROSCOPE (MISCELLANEOUS) ×3 IMPLANT

## 2020-02-14 NOTE — ED Notes (Signed)
Patient given Ginger ale at this time. 

## 2020-02-14 NOTE — H&P (Signed)
History and Physical    Alexander Foley DPO:[id number] DOB: October 10, 1990 DOA: 02/13/2020  PCP: Patient, No Pcp Per (Confirm with patient/family/NH records and if not entered, this has to be entered at [facility] point of entry) [facility] from: [facility]  I have personally briefly reviewed patient's old medical records in [facility]  Chief Complaint: Abdominal pain  HPI: Alexander Foley is a 29 y.o. male with no history of chronic medical problems presented to ED complaining of severe epigastric and right upper quadrant pain with nausea and vomiting.  Patient states that he was recently diagnosed with gallbladder issues and was discharged home with ciprofloxacin but it did not help and he continues to have severe nausea, vomiting right upper quadrant abdominal pain.  Patient states that abdominal pain is colicky in nature, intermittent, 7 out of 10 on pain scale, relieved with nothing and getting worse with movement and is associated with nausea and vomiting.  Patient denies smoking, alcohol or illicit drug use.  (For level 3, the HPI must include 4+ descriptors: Location, Quality, Severity, Duration, Timing, Context, modifying factors, associated signs/symptoms and/or status of 3+ chronic problems.)  (Please avoid self-populating past medical history here) (The initial 2-3 lines should be focused and good to copy and paste in the HPI section of the daily progress note).  ED Course: On arrival to the ED patient had temperature of 98, blood pressure 131/94, heart rate 98, respiratory rate 16 and oxygen saturation 99% on room air.  Blood work showed WBC 4.5, hemoglobin 15.7, sodium 137, potassium 3.4, BUN 9, creatinine 1.3, blood glucose 96.  Patient is given a bolus of normal saline, started on 2 g of Rocephin, 1 dose of IV Protonix 40 mg and 1 dose of IV Zofran.  ED physician contacted general surgery who advised admission to the hospital and IV antibiotics.  General surgery also recommended ultrasound in the  morning and patient will be evaluated by them in the morning.  Review of Systems: As per HPI otherwise 10 point review of systems negative.  Unacceptable ROS statements: "10 systems reviewed," "Extensive" (without elaboration).  Acceptable ROS statements: "All others negative," "All others reviewed and are negative," and "All others unremarkable," with at LEAST ONE ROS documented Can't double dip - if using for HPI can't use for ROS  Past Medical History:  Diagnosis Date  . Renal disorder    kidney stones    History reviewed. No pertinent surgical history.   reports that he has never smoked. He has never used smokeless tobacco. He reports previous alcohol use. He reports previous drug use.  Allergies  Allergen Reactions  . Tuna [Fish Allergy] Shortness Of Breath and Swelling    History reviewed. No pertinent family history.  Unacceptable: Noncontributory, unremarkable, or negative. Acceptable: (example)Family history negative for heart disease  Prior to Admission medications   Medication Sig Start Date End Date Taking? Authorizing Provider  ondansetron (ZOFRAN ODT) 4 MG disintegrating tablet Take 1 tablet (4 mg total) by mouth every 8 (eight) hours as needed for nausea or vomiting. 02/04/20   Tanda Rockers, PA-C    Physical Exam: Vitals:   02/14/20 0000 02/14/20 0100 02/14/20 0200 02/14/20 0300  BP: 119/77 116/79 119/87 110/77  Pulse: 80 81 86 75  Resp: 17 16 (!) 24 17  Temp:      SpO2: 98% 99% 100% 98%  Weight:      Height:        Constitutional: NAD, calm, comfortable Vitals:   02/14/20 0000  02/14/20 0100 02/14/20 0200 02/14/20 0300  BP: 119/77 116/79 119/87 110/77  Pulse: 80 81 86 75  Resp: 17 16 (!) 24 17  Temp:      SpO2: 98% 99% 100% 98%  Weight:      Height:        General: 29 year old African-American male in no acute distress. Eyes: PERRL, lids and conjunctivae normal ENMT: Mucous membranes are moist. Posterior pharynx clear of any exudate or  lesions.Normal dentition.  Neck: normal, supple, no masses, no thyromegaly Respiratory: clear to auscultation bilaterally, no wheezing, no crackles. Normal respiratory effort. No accessory muscle use.  Cardiovascular: Regular rate and rhythm, no murmurs / rubs / gallops. No extremity edema. 2+ pedal pulses. No carotid bruits.  Abdomen: Soft, nondistended but severely tender on palpation in epigastric region and right upper quadrant region. No hepatosplenomegaly. Bowel sounds positive.  Musculoskeletal: no clubbing / cyanosis. No joint deformity upper and lower extremities. Good ROM, no contractures. Normal muscle tone.  Skin: Multiple tattoos on upper extremities and frontal chest Neurologic: CN 2-12 grossly intact. Sensation intact, DTR normal. Strength 5/5 in all 4.  Psychiatric: Normal judgment and insight. Alert and oriented x 3. Normal mood.   (Anything < 9 systems with 2 bullets each down codes to level 1) (If patient refuses exam can't bill higher level) (Make sure to document decubitus ulcers present on admission -- if possible -- and whether patient has chronic indwelling catheter at time of admission)  Labs on Admission: I have personally reviewed following labs and imaging studies  CBC: Recent Labs  Lab 02/13/20 2356  WBC 4.5  NEUTROABS 2.1  HGB 15.7  HCT 46.2  MCV 85.7  PLT 370   Basic Metabolic Panel: Recent Labs  Lab 02/13/20 2356  NA 137  K 3.4*  CL 104  CO2 25  GLUCOSE 96  BUN 9  CREATININE 1.30*  CALCIUM 9.1   GFR: Estimated Creatinine Clearance: 92.6 mL/min (A) (by C-G formula based on SCr of 1.3 mg/dL (H)). Liver Function Tests: Recent Labs  Lab 02/13/20 2356  AST 26  ALT 22  ALKPHOS 55  BILITOT 0.9  PROT 7.3  ALBUMIN 4.4   Recent Labs  Lab 02/13/20 2356  LIPASE 25   No results for input(s): AMMONIA in the last 168 hours. Coagulation Profile: No results for input(s): INR, PROTIME in the last 168 hours. Cardiac Enzymes: No results for  input(s): CKTOTAL, CKMB, CKMBINDEX, TROPONINI in the last 168 hours. BNP (last 3 results) No results for input(s): PROBNP in the last 8760 hours. HbA1C: No results for input(s): HGBA1C in the last 72 hours. CBG: No results for input(s): GLUCAP in the last 168 hours. Lipid Profile: No results for input(s): CHOL, HDL, LDLCALC, TRIG, CHOLHDL, LDLDIRECT in the last 72 hours. Thyroid Function Tests: No results for input(s): TSH, T4TOTAL, FREET4, T3FREE, THYROIDAB in the last 72 hours. Anemia Panel: No results for input(s): VITAMINB12, FOLATE, FERRITIN, TIBC, IRON, RETICCTPCT in the last 72 hours. Urine analysis:    Component Value Date/Time   COLORURINE AMBER (A) 02/04/2020 1209   APPEARANCEUR CLEAR 02/04/2020 1209   LABSPEC 1.029 02/04/2020 1209   PHURINE 5.0 02/04/2020 1209   GLUCOSEU NEGATIVE 02/04/2020 1209   HGBUR NEGATIVE 02/04/2020 1209   BILIRUBINUR SMALL (A) 02/04/2020 1209   KETONESUR 20 (A) 02/04/2020 1209   PROTEINUR 30 (A) 02/04/2020 1209   NITRITE NEGATIVE 02/04/2020 1209   LEUKOCYTESUR NEGATIVE 02/04/2020 1209    Radiological Exams on Admission: No results found.  Assessment/Plan Principal Problem:   Biliary colic Patient  recently found to have gallbladder stone without evidence of cholecystitis and was discharged home on ciprofloxacin but failed outpatient treatment and presented today with right upper quadrant pain, intractable nausea and vomiting.  Patient given 2 g of IV Rocephin and IV fluids in the ED. Continue 2 mg IV Rocephin daily. IV Zofran every 6 hours as needed for nausea and vomiting Tylenol as needed for mild pain, Norco as needed for moderate pain while IV morphine 2 mg every 4 hours as needed for severe pain ordered. General surgery consulted and they recommended admission to the hospital. Patient will likely have cholecystectomy in the next 24 to 48 hours. Ultrasound in the morning as per general surgery recommendations.  Active  Problems:    Nausea and vomiting IV Zofran every 6 hours as needed for nausea and vomiting.  (please populate well all problems here in Problem List. (For example, if patient is on BP meds at home and you resume or decide to hold them, it is a problem that needs to be her. Same for CAD, COPD, HLD and so on)     DVT prophylaxis: Heparin Code Status: Full code Family Communication: No family member present at the bedside Disposition Plan:  Consults called: General surgery Admission status: Inpatient/telemetry   Thalia Party MD [facility] Pager [phone]   If 7PM-7AM, please contact night-coverage [email or web address] Password [email or web address]  02/14/2020, 4:09 AM

## 2020-02-14 NOTE — Anesthesia Postprocedure Evaluation (Signed)
Anesthesia Post Note  Patient: Alexander Foley  Procedure(s) Performed: LAPAROSCOPIC CHOLECYSTECTOMY (N/A )  Patient location during evaluation: PACU Anesthesia Type: General Level of consciousness: awake and alert and patient cooperative Pain management: satisfactory to patient Vital Signs Assessment: post-procedure vital signs reviewed and stable Respiratory status: spontaneous breathing Cardiovascular status: stable Postop Assessment: no apparent nausea or vomiting Anesthetic complications: no   No complications documented.   Last Vitals:  Vitals:   02/14/20 1230 02/14/20 1245  BP: 125/84 122/83  Pulse: 88 89  Resp: 10 17  Temp:    SpO2: 99% 93%    Last Pain:  Vitals:   02/14/20 1254  TempSrc:   PainSc: Asleep                 Nesreen Albano

## 2020-02-14 NOTE — Anesthesia Procedure Notes (Signed)
Procedure Name: Intubation Date/Time: 02/14/2020 10:53 AM Performed by: Vista Deck, CRNA Pre-anesthesia Checklist: Patient identified, Patient being monitored, Timeout performed, Emergency Drugs available and Suction available Patient Re-evaluated:Patient Re-evaluated prior to induction Oxygen Delivery Method: Circle System Utilized Preoxygenation: Pre-oxygenation with 100% oxygen Induction Type: IV induction Laryngoscope Size: Mac and 3 Grade View: Grade II Tube type: Oral Tube size: 7.5 mm Number of attempts: 1 Airway Equipment and Method: stylet Placement Confirmation: ETT inserted through vocal cords under direct vision,  positive ETCO2 and breath sounds checked- equal and bilateral Secured at: 23 cm Tube secured with: Tape Dental Injury: Teeth and Oropharynx as per pre-operative assessment

## 2020-02-14 NOTE — Progress Notes (Signed)
[facility]  Diet. Scheduled tylenol and ibuprofen, PRN roxicodone and morphine for pain. Stay overnight and hope home in the AM.   Algis Greenhouse, MD [facility] [address] [place], [place] [address] [phone] (office)

## 2020-02-14 NOTE — Op Note (Signed)
Operative Note   Preoperative Diagnosis: Biliary colic with intractable nausea/ vomiting    Postoperative Diagnosis: Same   Procedure(s) Performed: Laparoscopic cholecystectomy   Surgeon: Lillia Abed C. Henreitta Leber, MD   Assistants: No qualified resident was available   Anesthesia: General endotracheal   Anesthesiologist: Windell Norfolk, MD    Specimens: Gallbladder    Estimated Blood Loss: Minimal    Blood Replacement: None    Complications: None    Operative Findings: Gallbladder with distention    Procedure: The patient was taken to the operating room and placed supine. General endotracheal anesthesia was induced. Intravenous antibiotics were administered per protocol. An orogastric tube positioned to decompress the stomach. The abdomen was prepared and draped in the usual sterile fashion.    A supraumbilical incision was made and a Veress technique was utilized to achieve pneumoperitoneum to 15 mmHg with carbon dioxide. A 11 mm optiview port was placed through the supraumbilical region, and a 10 mm 0-degree operative laparoscope was introduced. The area underlying the trocar and Veress needle were inspected and without evidence of injury.  Remaining trocars were placed under direct vision. Two 5 mm ports were placed in the right abdomen, between the anterior axillary and midclavicular line.  A final 11 mm port was placed through the mid-epigastrium, near the falciform ligament.    The gallbladder fundus was elevated cephalad and the infundibulum was retracted to the patient's right. The gallbladder/cystic duct junction was skeletonized. The cystic artery noted in the triangle of Calot and was also skeletonized.  We then continued liberal medial and lateral dissection until the critical view of safety was achieved.    The cystic duct was triply clipped and cystic artery was doubly clipped and both were divided. The gallbladder was then dissected from the liver bed with electrocautery. The  specimen was placed in an Endopouch and was retrieved through the epigastric site.   Final inspection revealed acceptable hemostasis. Surgical SNOW was placed in the gallbladder bed.  Trocars were removed and pneumoperitoneum was released.  The epigastric and umbilical port sites were over the rib and smaller than my finger tip respectively. Skin incisions were closed with 4-0 Monocryl subcuticular sutures and Dermabond. The patient was awakened from anesthesia and extubated without complication.    Algis Greenhouse, MD [facility] [address] [place], [place] [address] [phone] (office)

## 2020-02-14 NOTE — Consult Note (Addendum)
[facility] Consult  Reason for Consult: Biliary colic with intractable nausea/ vomiting, ? Cholecystitis  Referring Physician:  Dr. Sherryll Burger   Chief Complaint    Abdominal Pain      HPI: Alexander Foley is a 29 y.o. male with a recent history of 2-3 weeks of upper abdominal pain with epigastric and RUQ pain that is worsening and some pain on the left that is referring. He has associated nausea and vomiting. He says every time he eats he feels dizzy and throws up. He says that he cannot really keep anything down. He was seen in the ED 6/21 and worked up for possible gallbladder issues and found to have sludge in the gallbladder but no cholecystitis. He was placed on ciprofloxacin after the ED talked with Dr. Lovell Sheehan with plans for follow up in clinic. He had a clinic appt today. He continued to have worsening symptoms and was brought back to the hospital last night. I asked the hospitalist to admit for plans for cholecystectomy.  He denies any NSAID use.   Past Medical History:  Diagnosis Date  . Renal disorder    kidney stones    History reviewed. No pertinent surgical history.  History reviewed. No pertinent family history.  No cardiac disease in the family   Social History   Tobacco Use  . Smoking status: Never Smoker  . Smokeless tobacco: Never Used  Substance Use Topics  . Alcohol use: Not Currently  . Drug use: Not Currently    Medications: I have reviewed the patient's current medications. Current Facility-Administered Medications  Medication Dose Route Frequency Provider Last Rate Last Admin  . 0.9 %  sodium chloride infusion   Intravenous Continuous Maurilio Lovely D, DO 75 mL/hr at 02/14/20 0749 Rate Change at 02/14/20 0749  . [MAR Hold] acetaminophen (TYLENOL) tablet 650 mg  650 mg Oral Q6H PRN Renda Rolls Z, DO       Or  . Mitzi Hansen Hold] acetaminophen (TYLENOL) suppository 650 mg  650 mg Rectal Q6H PRN Renda Rolls Z, DO      . cefoTEtan (CEFOTAN) 2 g in  sodium chloride 0.9 % 100 mL IVPB  2 g Intravenous On Call to OR Lucretia Roers, MD      . Mitzi Hansen Hold] cefTRIAXone (ROCEPHIN) 2 g in sodium chloride 0.9 % 100 mL IVPB  2 g Intravenous Q24H Renda Rolls Z, DO      . Chlorhexidine Gluconate Cloth 2 % PADS 6 each  6 each Topical Once Lucretia Roers, MD      . [facility] Hold] heparin injection 5,000 Units  5,000 Units Subcutaneous Q8H Renda Rolls Z, DO   5,000 Units at 02/14/20 1740  . [MAR Hold] HYDROcodone-acetaminophen (NORCO/VICODIN) 5-325 MG per tablet 1-2 tablet  1-2 tablet Oral Q4H PRN Renda Rolls Z, DO      . [MAR Hold] morphine 2 MG/ML injection 2 mg  2 mg Intravenous Q4H PRN Renda Rolls Z, DO      . [MAR Hold] ondansetron [facility]) tablet 4 mg  4 mg Oral Q6H PRN Renda Rolls Z, DO       Or  . Mitzi Hansen Hold] ondansetron [facility]) injection 4 mg  4 mg Intravenous Q6H PRN Renda Rolls Z, DO        Allergies  Allergen Reactions  . Tuna [Fish Allergy] Shortness Of Breath and Swelling     ROS:  A comprehensive review of systems was negative except for: Gastrointestinal: positive for abdominal pain, nausea and  vomiting  Blood pressure 126/83, pulse 97, temperature 98.1 F (36.7 C), temperature source Oral, resp. rate 19, height 5\' 9"  (1.753 m), weight 87.5 kg, SpO2 100 %. Physical Exam Vitals reviewed.  Constitutional:      Appearance: He is normal weight.  HENT:     Head: Normocephalic and atraumatic.  Eyes:     Extraocular Movements: Extraocular movements intact.  Cardiovascular:     Rate and Rhythm: Normal rate and regular rhythm.  Pulmonary:     Effort: Pulmonary effort is normal.     Breath sounds: Normal breath sounds.  Abdominal:     General: There is no distension.     Palpations: Abdomen is soft.     Tenderness: There is abdominal tenderness in the right upper quadrant and epigastric area.  Musculoskeletal:     Comments: Moves all extremities   Skin:    General: Skin is warm and dry.  Neurological:      General: No focal deficit present.     Mental Status: He is alert and oriented to person, place, and time.  Psychiatric:        Mood and Affect: Mood normal.        Behavior: Behavior normal.     Results: Results for orders placed or performed during the hospital encounter of 02/13/20 (from the past 48 hour(s))  CBC with Differential/Platelet     Status: None   Collection Time: 02/13/20 11:56 PM  Result Value Ref Range   WBC 4.5 4.0 - 10.5 K/uL   RBC 5.39 4.22 - 5.81 MIL/uL   Hemoglobin 15.7 13.0 - 17.0 g/dL   HCT 16.146.2 39 - 52 %   MCV 85.7 80.0 - 100.0 fL   MCH 29.1 26.0 - 34.0 pg   MCHC 34.0 30.0 - 36.0 g/dL   RDW 09.612.1 04.511.5 - 40.915.5 %   Platelets 370 150 - 400 K/uL   nRBC 0.0 0.0 - 0.2 %   Neutrophils Relative % 47 %   Neutro Abs 2.1 1.7 - 7.7 K/uL   Lymphocytes Relative 30 %   Lymphs Abs 1.3 0.7 - 4.0 K/uL   Monocytes Relative 18 %   Monocytes Absolute 0.8 0 - 1 K/uL   Eosinophils Relative 4 %   Eosinophils Absolute 0.2 0 - 0 K/uL   Basophils Relative 1 %   Basophils Absolute 0.1 0 - 0 K/uL   Immature Granulocytes 0 %   Abs Immature Granulocytes 0.01 0.00 - 0.07 K/uL    Comment: Performed at [facility], [address] [place], [place] [address]  Comprehensive metabolic panel     Status: Abnormal   Collection Time: 02/13/20 11:56 PM  Result Value Ref Range   Sodium 137 135 - 145 mmol/L   Potassium 3.4 (L) 3.5 - 5.1 mmol/L   Chloride 104 98 - 111 mmol/L   CO2 25 22 - 32 mmol/L   Glucose, Bld 96 70 - 99 mg/dL    Comment: Glucose reference range applies only to samples taken after fasting for at least 8 hours.   BUN 9 6 - 20 mg/dL   Creatinine, Ser 4.781.30 (H) 0.61 - 1.24 mg/dL   Calcium 9.1 8.9 - 29.510.3 mg/dL   Total Protein 7.3 6.5 - 8.1 g/dL   Albumin 4.4 3.5 - 5.0 g/dL   AST 26 15 - 41 U/L   ALT 22 0 - 44 U/L   Alkaline Phosphatase 55 38 - 126 U/L   Total Bilirubin 0.9 0.3 - 1.2  mg/dL   GFR calc non Af Amer >60 >60 mL/min   GFR calc Af Amer >60 >60 mL/min   Anion  gap 8 5 - 15    Comment: Performed at [facility], [address] [place], [place] [address]  Lipase, blood     Status: None   Collection Time: 02/13/20 11:56 PM  Result Value Ref Range   Lipase 25 11 - 51 U/L    Comment: Performed at [facility], [address], [place], [place] [address]  HIV Antibody (routine testing w rflx)     Status: None   Collection Time: 02/13/20 11:56 PM  Result Value Ref Range   HIV Screen 4th Generation wRfx Non Reactive Non Reactive    Comment: Performed at [facility], [address], [place], [place] [address]  SARS Coronavirus 2 by RT PCR (hospital order, performed in [facility] hospital lab) Nasopharyngeal Nasopharyngeal Swab     Status: None   Collection Time: 02/14/20  1:55 AM   Specimen: Nasopharyngeal Swab  Result Value Ref Range   SARS Coronavirus 2 NEGATIVE NEGATIVE    Comment: (NOTE) SARS-CoV-2 target nucleic acids are NOT DETECTED.  The SARS-CoV-2 RNA is generally detectable in upper and lower respiratory specimens during the acute phase of infection. The lowest concentration of SARS-CoV-2 viral copies this assay can detect is 250 copies / mL. A negative result does not preclude SARS-CoV-2 infection and should not be used as the sole basis for treatment or other patient management decisions.  A negative result may occur with improper specimen collection / handling, submission of specimen other than nasopharyngeal swab, presence of viral mutation(s) within the areas targeted by this assay, and inadequate number of viral copies (<250 copies / mL). A negative result must be combined with clinical observations, patient history, and epidemiological information.  Fact Sheet for Patients:   BoilerBrush.com.cy  Fact Sheet for Healthcare Providers: https://pope.com/  This test is not yet approved or  cleared by the [place] FDA and has been authorized for detection and/or diagnosis of  SARS-CoV-2 by FDA under an Emergency Use Authorization (EUA).  This EUA will remain in effect (meaning this test can be used) for the duration of the COVID-19 declaration under Section 564(b)(1) of the Act, 21 U.S.C. section 360bbb-3(b)(1), unless the authorization is terminated or revoked sooner.  Performed at [facility], [address], [place], [place] [address]   MRSA PCR Screening     Status: None   Collection Time: 02/14/20  6:00 AM   Specimen: Nasopharyngeal  Result Value Ref Range   MRSA by PCR NEGATIVE NEGATIVE    Comment:        The GeneXpert MRSA Assay (FDA approved for NASAL specimens only), is one component of a comprehensive MRSA colonization surveillance program. It is not intended to diagnose MRSA infection nor to guide or monitor treatment for MRSA infections. Performed at [facility], [address] [place], [place] [address]   CBC     Status: Abnormal   Collection Time: 02/14/20  6:23 AM  Result Value Ref Range   WBC 3.9 (L) 4.0 - 10.5 K/uL   RBC 4.91 4.22 - 5.81 MIL/uL   Hemoglobin 14.3 13.0 - 17.0 g/dL   HCT 13.0 39 - 52 %   MCV 87.0 80.0 - 100.0 fL   MCH 29.1 26.0 - 34.0 pg   MCHC 33.5 30.0 - 36.0 g/dL   RDW 86.5 78.4 - 69.6 %   Platelets 324 150 -  400 K/uL   nRBC 0.0 0.0 - 0.2 %    Comment: Performed at [facility], [address] [place], [place] [address]  Comprehensive metabolic panel     Status: Abnormal   Collection Time: 02/14/20  6:23 AM  Result Value Ref Range   Sodium 139 135 - 145 mmol/L   Potassium 3.6 3.5 - 5.1 mmol/L   Chloride 103 98 - 111 mmol/L   CO2 26 22 - 32 mmol/L   Glucose, Bld 91 70 - 99 mg/dL    Comment: Glucose reference range applies only to samples taken after fasting for at least 8 hours.   BUN 8 6 - 20 mg/dL   Creatinine, Ser 8.56 (H) 0.61 - 1.24 mg/dL   Calcium 8.8 (L) 8.9 - 10.3 mg/dL   Total Protein 6.5 6.5 - 8.1 g/dL   Albumin 3.8 3.5 - 5.0 g/dL   AST 23 15 - 41 U/L   ALT 20 0 - 44 U/L   Alkaline  Phosphatase 48 38 - 126 U/L   Total Bilirubin 0.7 0.3 - 1.2 mg/dL   GFR calc non Af Amer >60 >60 mL/min   GFR calc Af Amer >60 >60 mL/min   Anion gap 10 5 - 15    Comment: Performed at [facility], [address] [place], [place] [address]    Personally reviewed imaging- [place] with stone / sludge and distended, no wall thickening, CBD normal, CT with distended gallbladder, no other abdominal process, ? Hilar node that needs follow up CT chest in 3 months   Assessment & Plan:  Alexander Foley is a 29 y.o. male with biliary colic with intractable nausea and vomiting but potentially some degree of cholecystitis that has been masked by his antibiotic use.   -PLAN: I counseled the patient about the indication, risks and benefits of laparoscopic cholecystectomy.  He understands there is a very small chance for bleeding, infection, injury to normal structures (including common bile duct), conversion to open surgery, persistent symptoms, evolution of postcholecystectomy diarrhea, need for secondary interventions, anesthesia reaction, cardiopulmonary issues and other risks not specifically detailed here. I described the expected recovery, the plan for follow-up and the restrictions during the recovery phase.  All questions were answered.  COVID negative   All questions were answered to the satisfaction of the patient.    Lucretia Roers 02/14/2020, 10:29 AM

## 2020-02-14 NOTE — Anesthesia Preprocedure Evaluation (Signed)
Anesthesia Evaluation  Patient identified by MRN, date of birth, ID band Patient awake    Reviewed: Allergy & Precautions, H&P , NPO status , Patient's Chart, lab work & pertinent test results, reviewed documented beta blocker date and time   Airway Mallampati: II  TM Distance: >3 FB Neck ROM: full    Dental no notable dental hx.    Pulmonary neg pulmonary ROS,    Pulmonary exam normal breath sounds clear to auscultation       Cardiovascular Exercise Tolerance: Good negative cardio ROS   Rhythm:regular Rate:Normal     Neuro/Psych negative neurological ROS  negative psych ROS   GI/Hepatic negative GI ROS, Neg liver ROS,   Endo/Other  negative endocrine ROS  Renal/GU negative Renal ROS  negative genitourinary   Musculoskeletal   Abdominal   Peds  Hematology negative hematology ROS (+)   Anesthesia Other Findings   Reproductive/Obstetrics negative OB ROS                             Anesthesia Physical Anesthesia Plan  ASA: II  Anesthesia Plan: General   Post-op Pain Management:    Induction:   PONV Risk Score and Plan: 2  Airway Management Planned:   Additional Equipment:   Intra-op Plan:   Post-operative Plan:   Informed Consent: I have reviewed the patients History and Physical, chart, labs and discussed the procedure including the risks, benefits and alternatives for the proposed anesthesia with the patient or authorized representative who has indicated his/her understanding and acceptance.     Dental Advisory Given  Plan Discussed with: CRNA  Anesthesia Plan Comments:         Anesthesia Quick Evaluation

## 2020-02-14 NOTE — Plan of Care (Signed)

## 2020-02-14 NOTE — Transfer of Care (Signed)
Immediate Anesthesia Transfer of Care Note  Patient: Alexander Foley  Procedure(s) Performed: LAPAROSCOPIC CHOLECYSTECTOMY (N/A )  Patient Location: PACU  Anesthesia Type:General  Level of Consciousness: awake and patient cooperative  Airway & Oxygen Therapy: Patient Spontanous Breathing and non-rebreather face mask  Post-op Assessment: Report given to RN and Post -op Vital signs reviewed and stable  Post vital signs: Reviewed and stable98.5  Last Vitals:  Vitals Value Taken Time  BP 129/89 02/14/20 1203  Temp 98.5   Pulse 95 02/14/20 1204  Resp 17 02/14/20 1204  SpO2 100 % 02/14/20 1204  Vitals shown include unvalidated device data.  Last Pain:  Vitals:   02/14/20 1023  TempSrc: Oral  PainSc: 6       Patients Stated Pain Goal: 9 (02/14/20 1023)  Complications: No complications documented.

## 2020-02-14 NOTE — ED Provider Notes (Signed)
[facility] EMERGENCY DEPARTMENT Provider Note   CSN: [id number] Arrival date & time: 02/13/20  2145     History Chief Complaint  Patient presents with  . Abdominal Pain    Alexander Foley is a 29 y.o. male.  The history is provided by the patient.  Abdominal Pain Pain location:  Epigastric and RUQ Pain radiates to:  Does not radiate Pain severity:  Moderate Onset quality:  Gradual Duration:  10 days Timing:  Intermittent Progression:  Worsening Chronicity:  Recurrent Relieved by:  Nothing Worsened by:  Movement and palpation Associated symptoms: diarrhea, nausea, shortness of breath and vomiting   Associated symptoms: no fever and no hematochezia   Patient presents for recurrent abdominal pain and vomiting Patient reports he was recently diagnosed with gallbladder issues and since that time he has had very little p.o. intake.  He reports most foods make him nauseous and he vomits.  He also reports continued upper abdominal pain.  Tonight he reports while at the prison his vomiting worsened and he felt short of breath.  No new chest pain     Past Medical History:  Diagnosis Date  . Renal disorder    kidney stones    There are no problems to display for this patient.   History reviewed. No pertinent surgical history.     History reviewed. No pertinent family history.  Social History   Tobacco Use  . Smoking status: Never Smoker  . Smokeless tobacco: Never Used  Substance Use Topics  . Alcohol use: Not Currently  . Drug use: Not Currently    Home Medications Prior to Admission medications   Medication Sig Start Date End Date Taking? Authorizing Provider  ondansetron (ZOFRAN ODT) 4 MG disintegrating tablet Take 1 tablet (4 mg total) by mouth every 8 (eight) hours as needed for nausea or vomiting. 02/04/20   Tanda Rockers, PA-C    Allergies    [place] allergy]  Review of Systems   Review of Systems  Constitutional: Negative for fever.  Respiratory:  Positive for shortness of breath.   Gastrointestinal: Positive for abdominal pain, diarrhea, nausea and vomiting. Negative for hematochezia.  All other systems reviewed and are negative.   Physical Exam Updated Vital Signs BP 116/79   Pulse 81   Temp 98 F (36.7 C)   Resp 16   Ht 1.753 m (5\' 9" )   Wt 87.5 kg   SpO2 99%   BMI 28.50 kg/m   Physical Exam CONSTITUTIONAL: Well developed/well nourished HEAD: Normocephalic/atraumatic EYES: EOMI/PERRL, no icterus ENMT: Mucous membranes moist NECK: supple no meningeal signs SPINE/BACK:entire spine nontender CV: S1/S2 noted, no murmurs/rubs/gallops noted LUNGS: Lungs are clear to auscultation bilaterally, no apparent distress ABDOMEN: soft, moderate RUQ/epigastric tenderness, no rebound or guarding, bowel sounds noted throughout abdomen GU:no cva tenderness NEURO: Pt is awake/alert/appropriate, moves all extremitiesx4.  No facial droop.  EXTREMITIES: pulses normal/equal, full ROM, hands are in cuffs SKIN: warm, color normal PSYCH: no abnormalities of mood noted, alert and oriented to situation  ED Results / Procedures / Treatments   Labs (all labs ordered are listed, but only abnormal results are displayed) Labs Reviewed  COMPREHENSIVE METABOLIC PANEL - Abnormal; Notable for the following components:      Result Value   Potassium 3.4 (*)    Creatinine, Ser 1.30 (*)    All other components within normal limits  SARS CORONAVIRUS 2 BY RT PCR (HOSPITAL ORDER, PERFORMED IN [facility])  CBC WITH DIFFERENTIAL/PLATELET  LIPASE, BLOOD  EKG EKG Interpretation  Date/Time:  Wednesday February 13 2020 23:49:30 EDT Ventricular Rate:  85 PR Interval:    QRS Duration: 86 QT Interval:  374 QTC Calculation: 445 R Axis:   -4 Text Interpretation: Sinus rhythm Borderline T abnormalities, inferior leads No significant change since last tracing Interpretation limited secondary to artifact Confirmed by Zadie Rhine ([id number]) on  02/13/2020 11:58:54 PM   Radiology No results found.  Procedures Procedures Medications Ordered in ED Medications  cefTRIAXone (ROCEPHIN) 2 g in sodium chloride 0.9 % 100 mL IVPB (2 g Intravenous New Bag/Given 02/14/20 0201)  ondansetron (ZOFRAN) injection 4 mg (4 mg Intravenous Given 02/14/20 0119)  sodium chloride 0.9 % bolus 1,000 mL (0 mLs Intravenous Stopped 02/14/20 0155)  pantoprazole (PROTONIX) injection 40 mg (40 mg Intravenous Given 02/14/20 0122)    ED Course  I have reviewed the triage vital signs and the nursing notes.  Pertinent labs  results that were available during my care of the patient were reviewed by me and considered in my medical decision making (see chart for details).    MDM Rules/Calculators/A&P                           This patient presents to the ED for concern of abdominal pain, this involves an extensive number of treatment options, and is a complaint that carries with it a high risk of complications and morbidity.  The differential diagnosis includes peptic ulcer disease, pancreatitis, cholecystitis, appendicitis   Lab Tests:   I Ordered, reviewed, and interpreted labs, which included lipase, liver function test, electrolytes, complete blood count  Medicines ordered:   I ordered medication Zofran, Protonix, Rocephin for nausea and cholelithiasis   Additional history obtained:    Previous records obtained and reviewed   Consultations Obtained:   I consulted general surgery Dr. Henreitta Leber and discussed lab  findings  She recommends medical admission.  Right upper quadrant ultrasound in the morning.  Start IV antibiotics.  Dr. Lovell Sheehan will need to be re-consulted  Discussed with Dr. Welton Flakes with [facility] hospitalist for admission  Reevaluation:  After the interventions stated above, I reevaluated the patient and found patient is stable  Patient found to have cholelithiasis on June 21 and started on Cipro at the direction of Dr. Lovell Sheehan.  Patient  is currently a prisoner [place] a local prison [place].  He reports continued abdominal pain and vomiting with most meals.  Patient appears to be failing outpatient management.  After discussion with general surgery he will be admitted to the hospital for likely cholecystectomy in the next 24-48 hours  No other signs of acute abdominal emergency this time.  Patient was updated on plan. Final Clinical Impression(s) / ED Diagnoses Final diagnoses:  Biliary colic  Calculus of gallbladder without cholecystitis without obstruction    Rx / DC Orders ED Discharge Orders    None       Zadie Rhine, MD 02/14/20 0222

## 2020-02-14 NOTE — Progress Notes (Signed)
Per HPI: Alexander Foley is a 29 y.o. male with no history of chronic medical problems presented to ED complaining of severe epigastric and right upper quadrant pain with nausea and vomiting.  Patient states that he was recently diagnosed with gallbladder issues and was discharged home with ciprofloxacin but it did not help and he continues to have severe nausea, vomiting right upper quadrant abdominal pain.  Patient states that abdominal pain is colicky in nature, intermittent, 7 out of 10 on pain scale, relieved with nothing and getting worse with movement and is associated with nausea and vomiting.  Patient denies smoking, alcohol or illicit drug use.   -Patient was seen and evaluated with guards at bedside.  He complained of minimal ongoing abdominal pain particularly to the right upper quadrant this AM.  He stated that he has worsening symptoms of abdominal pain as well as nausea and vomiting particularly with eating.  He was admitted with biliary colic and has now undergone laparoscopic cholecystectomy with no complications noted.  He does not have any other significant medical history.  Plan will be to transfer patient to general surgery service at this time.  Hospitalist service to sign off.  Please consult as needed.  Total care time: 20 minutes.

## 2020-02-15 ENCOUNTER — Inpatient Hospital Stay (HOSPITAL_COMMUNITY)

## 2020-02-15 ENCOUNTER — Encounter (HOSPITAL_COMMUNITY): Payer: Self-pay | Admitting: General Surgery

## 2020-02-15 LAB — SURGICAL PATHOLOGY

## 2020-02-15 MED ORDER — JUVEN PO PACK
1.0000 | PACK | Freq: Two times a day (BID) | ORAL | Status: DC
  Administered 2020-02-16: 1 via ORAL
  Filled 2020-02-15: qty 1

## 2020-02-15 MED ORDER — ENSURE MAX PROTEIN PO LIQD
11.0000 [oz_av] | Freq: Every day | ORAL | Status: DC
  Administered 2020-02-16: 11 [oz_av] via ORAL

## 2020-02-15 NOTE — Plan of Care (Signed)

## 2020-02-15 NOTE — Progress Notes (Signed)
Initial Nutrition Assessment  DOCUMENTATION CODES:      INTERVENTION:  Ensure Max po daily, each supplement provides 150 kcal and 30 grams of protein  Juven BID, each packet provides 95 calories, 2.5 grams of protein (collagen), and 9.8 grams of carbohydrate (3 grams sugar); also contains 7 grams of L-arginine and L-glutamine, 300 mg vitamin C, 15 mg vitamin E, 1.2 mcg vitamin B-12, 9.5 mg zinc, 200 mg calcium, and 1.5 g  Calcium Beta-hydroxy-Beta-methylbutyrate to support post-op wound healing  NUTRITION DIAGNOSIS:   Increased nutrient needs related to acute illness, post-op healing (biliary colic s/p laparoscopic cholecystectomy) as evidenced by estimated needs.    GOAL:   Patient will meet greater than or equal to 90% of their needs    MONITOR:   Labs, I & O's, Diet advancement, TF tolerance, Weight trends, PO intake  REASON FOR ASSESSMENT:   Malnutrition Screening Tool    ASSESSMENT:  RD working remotely.  29 year male with no past chronic medical history presented with complaints of severe epigastric and right upper quadrant pain with nausea and vomiting admitted for biliary colic.  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 Diet advanced to soft at 1125 today, per flowsheets he consumed 75% of lunch meal. Will provide Ensure Max to aid with meeting needs as well as Juven to support post-op wound healing.   No weight history available for review I/Os: +2448 ml since admit   +1315 ml x 24 hrs UOP: 700 ml x 24 hrs Medications reviewed and include: Tylenol, Ibuprofen Lactated ringers @ 75 ml/hr Labs reviewed  NUTRITION - FOCUSED PHYSICAL EXAM: Unable to complete at this time, RD working remotely.  Diet Order:   Diet Order            DIET SOFT Room service appropriate? Yes; Fluid consistency: Thin  Diet  effective now                 EDUCATION NEEDS:      Skin:  Skin Assessment: Skin Integrity Issues: Skin Integrity Issues:: Incisions Incisions: closed;abdomen  Last BM:  7/01  Height:   Ht Readings from Last 1 Encounters:  02/14/20 5\' 9"  (1.753 m)    Weight:   Wt Readings from Last 1 Encounters:  02/14/20 87.5 kg    Ideal Body Weight:  72.7 kg  BMI:  Body mass index is 28.49 kg/m.  Estimated Nutritional Needs:   Kcal:  2200-2400  Protein:  110-120  Fluid:  >/= 2.2 L/day   04/16/20, RD, LDN Clinical Nutrition After Hours/Weekend Pager # in Amion

## 2020-02-15 NOTE — Plan of Care (Signed)

## 2020-02-15 NOTE — Progress Notes (Signed)
[facility] Progress Note  1 Day Post-Op  Subjective: Doing fair but had significant nausea last night. Was finally able to keep down liquids today. Xray without ileus. Has started some solid foods. Sore.  Objective: Vital signs in last 24 hours: Temp:  [97.8 F (36.6 C)-98.4 F (36.9 C)] 98.4 F (36.9 C) (07/02 0625) Pulse Rate:  [78-90] 84 (07/02 0625) Resp:  [20] 20 (07/02 0625) BP: (116-135)/(82-91) 135/91 (07/02 0625) SpO2:  [99 %-100 %] 100 % (07/02 0625) Last BM Date: 02/14/20  Intake/Output from previous day: 07/01 0701 - 07/02 0700 In: 2025.9 [P.O.:350; I.V.:1575.9; IV Piggyback:100] Out: 710 [Urine:700; Blood:10] Intake/Output this shift: Total I/O In: 240 [P.O.:240] Out: -   General appearance: alert, cooperative and no distress Resp: normal work of breathing incisions with dermabond, no erythema or drainage  Lab Results:  Recent Labs    02/13/20 2356 02/14/20 0623  WBC 4.5 3.9*  HGB 15.7 14.3  HCT 46.2 42.7  PLT 370 324   BMET Recent Labs    02/13/20 2356 02/14/20 0623  NA 137 139  K 3.4* 3.6  CL 104 103  CO2 25 26  GLUCOSE 96 91  BUN 9 8  CREATININE 1.30* 1.31*  CALCIUM 9.1 8.8*    Studies/Results: DG Abd 1 View  Result Date: 02/15/2020 CLINICAL DATA:  Status post cholecystectomy.  Nausea, vomiting EXAM: ABDOMEN - 1 VIEW COMPARISON:  CT 02/04/2020 FINDINGS: The subdiaphragmatic region is excluded from view. Visualized abdominal gas pattern is unremarkable. Cholecystectomy clips noted within the right upper quadrant. No gross free intraperitoneal gas. No organomegaly. No abnormal intra-abdominal calcification. Osseous structures are unremarkable. IMPRESSION: Normal abdominal gas pattern Electronically Signed   By: Helyn Numbers MD   On: 02/15/2020 09:49    Anti-infectives: Anti-infectives (From admission, onward)   Start     Dose/Rate Route Frequency Ordered Stop   02/14/20 2200  cefTRIAXone (ROCEPHIN) 2 g in sodium  chloride 0.9 % 100 mL IVPB  Status:  Discontinued       Note to Pharmacy: Patient had first dose in ED. Pharmacy to adjust timing for next dose.   2 g 200 mL/hr over 30 Minutes Intravenous Every 24 hours 02/14/20 0349 02/14/20 1440   02/14/20 0945  cefoTEtan (CEFOTAN) 2 g in sodium chloride 0.9 % 100 mL IVPB        2 g 200 mL/hr over 30 Minutes Intravenous On call to O.R. 02/14/20 0941 02/14/20 1106   02/14/20 0200  cefTRIAXone (ROCEPHIN) 2 g in sodium chloride 0.9 % 100 mL IVPB        2 g 200 mL/hr over 30 Minutes Intravenous  Once 02/14/20 0154 02/14/20 0321      Assessment/Plan: Mr. Brislin is a 29 yo POD 1 s/p laparoscopic cholecystectomy for chronic cholecystitis. Doing better but just has started to tolerating diet. -tylenol and ibuprofen for pain, PRN narcotic -IS, OOB -Soft diet -D/c tomorrow  -Will need to call [phone] Ex [phone] to check on patient after d/c    LOS: 1 day    Lucretia Roers 02/15/2020

## 2020-02-16 LAB — COMPREHENSIVE METABOLIC PANEL
ALT: 33 U/L (ref 0–44)
AST: 33 U/L (ref 15–41)
Albumin: 3.5 g/dL (ref 3.5–5.0)
Alkaline Phosphatase: 47 U/L (ref 38–126)
Anion gap: 10 (ref 5–15)
BUN: 5 mg/dL — ABNORMAL LOW (ref 6–20)
CO2: 26 mmol/L (ref 22–32)
Calcium: 8.8 mg/dL — ABNORMAL LOW (ref 8.9–10.3)
Chloride: 101 mmol/L (ref 98–111)
Creatinine, Ser: 1.2 mg/dL (ref 0.61–1.24)
GFR calc Af Amer: >60 mL/min (ref >60)
GFR calc non Af Amer: >60 mL/min (ref >60)
Glucose, Bld: 91 mg/dL (ref 70–99)
Potassium: 3.5 mmol/L (ref 3.5–5.1)
Sodium: 137 mmol/L (ref 135–145)
Total Bilirubin: 1 mg/dL (ref 0.3–1.2)
Total Protein: 6.1 g/dL — ABNORMAL LOW (ref 6.5–8.1)

## 2020-02-16 LAB — CBC WITH DIFFERENTIAL/PLATELET
Abs Immature Granulocytes: 0.03 10*3/uL (ref 0.00–0.07)
Basophils Absolute: 0 10*3/uL (ref 0.0–0.1)
Basophils Relative: 1 %
Eosinophils Absolute: 0.1 10*3/uL (ref 0.0–0.5)
Eosinophils Relative: 2 %
HCT: 40.8 % (ref 39.0–52.0)
Hemoglobin: 13.7 g/dL (ref 13.0–17.0)
Immature Granulocytes: 0 %
Lymphocytes Relative: 9 %
Lymphs Abs: 0.7 10*3/uL (ref 0.7–4.0)
MCH: 29.3 pg (ref 26.0–34.0)
MCHC: 33.6 g/dL (ref 30.0–36.0)
MCV: 87.4 fL (ref 80.0–100.0)
Monocytes Absolute: 1.1 10*3/uL — ABNORMAL HIGH (ref 0.1–1.0)
Monocytes Relative: 14 %
Neutro Abs: 5.6 10*3/uL (ref 1.7–7.7)
Neutrophils Relative %: 74 %
Platelets: 284 10*3/uL (ref 150–400)
RBC: 4.67 MIL/uL (ref 4.22–5.81)
RDW: 12.4 % (ref 11.5–15.5)
WBC: 7.5 10*3/uL (ref 4.0–10.5)
nRBC: 0 % (ref 0.0–0.2)

## 2020-02-16 MED ORDER — PROMETHAZINE HCL 25 MG/ML IJ SOLN
12.5000 mg | Freq: Four times a day (QID) | INTRAMUSCULAR | Status: DC | PRN
  Administered 2020-02-16: 12.5 mg via INTRAVENOUS
  Filled 2020-02-16: qty 1

## 2020-02-16 MED ORDER — DOCUSATE SODIUM 100 MG PO CAPS: 100.0000 mg | ORAL_CAPSULE | Freq: Two times a day (BID) | ORAL | 0 refills | Status: AC | PRN

## 2020-02-16 MED ORDER — ONDANSETRON 4 MG PO TBDP: 4.0000 mg | ORAL_TABLET | Freq: Three times a day (TID) | ORAL | 0 refills | Status: AC | PRN

## 2020-02-16 MED ORDER — OXYCODONE HCL 5 MG PO TABS: 5.0000 mg | ORAL_TABLET | ORAL | 0 refills | Status: AC | PRN

## 2020-02-16 MED ORDER — LORAZEPAM 2 MG/ML IJ SOLN
1.0000 mg | Freq: Four times a day (QID) | INTRAMUSCULAR | Status: DC | PRN
  Administered 2020-02-16: 1 mg via INTRAVENOUS
  Filled 2020-02-16: qty 1

## 2020-02-16 NOTE — Progress Notes (Signed)
Nsg Discharge Note  Admit Date:  02/13/2020 Discharge date: 02/16/2020   Alexander Foley to be D/C'd to facility per MD order.  AVS completed. Patient/caregiver able to verbalize understanding.  Discharge Medication: Allergies as of 02/16/2020      Reactions   Tuna [fish Allergy] Shortness Of Breath, Swelling      Medication List    TAKE these medications   docusate sodium 100 MG capsule Commonly known as: Colace Take 1 capsule (100 mg total) by mouth 2 (two) times daily as needed for mild constipation.   ondansetron 4 MG disintegrating tablet Commonly known as: Zofran ODT Take 1 tablet (4 mg total) by mouth every 8 (eight) hours as needed for nausea or vomiting.   oxyCODONE 5 MG immediate release tablet Commonly known as: Oxy IR/ROXICODONE Take 1 tablet (5 mg total) by mouth every 4 (four) hours as needed for severe pain or breakthrough pain.            Discharge Care Instructions  (From admission, onward)         Start     Ordered   02/16/20 0000  No dressing needed       Comments: Ok to shower. Do not pick at the glue over the incisions.   02/16/20 1152          Discharge Assessment: Vitals:   02/16/20 0409 02/16/20 1328  BP: 124/84 127/86  Pulse: 84 85  Resp: 18 20  Temp: 98.6 F (37 C) 98.1 F (36.7 C)  SpO2: 100% 100%   Skin clean, dry and intact without evidence of skin break down, no evidence of skin tears noted. IV catheter discontinued intact. Site without signs and symptoms of complications - no redness or edema noted at insertion site, patient denies c/o pain - only slight tenderness at site.  Dressing with slight pressure applied.  D/c Instructions-Education: Discharge instructions given to patient/family with verbalized understanding. D/c education completed with patient/family including follow up instructions, medication list, d/c activities limitations if indicated, with other d/c instructions as indicated by MD - patient able to verbalize  understanding, all questions fully answered. Patient instructed to return to ED, call 911, or call MD for any changes in condition.  Patient escorted via WC, and [facility].  Kizzie Bane, RN 02/16/2020 2:55 PM

## 2020-02-16 NOTE — Progress Notes (Signed)
[facility]  [facility] as he will need pain medication and no RN at his site for next two days per the guards. Called physician, Dr. Stacie Glaze, at facility [facility] complex.   Notified of surgery. Post op pain needs. Possibly nausea but tolerating diet at discharge.   [phone].  Algis Greenhouse, MD

## 2020-02-16 NOTE — Discharge Summary (Addendum)
Physician Discharge Summary  Patient ID: Alexander Foley MRN: [id number] DOB/AGE: Sep 26, 1990 29 y.o.  Admit date: 02/13/2020 Discharge date: 02/16/2020  Admission Diagnoses: Biliary colic and intractable nausea/ vomiting   Discharge Diagnoses:  Principal Problem:   Biliary colic Active Problems:   Nausea and vomiting  Pathology: Clinical History: cholelithiasis, biliary colic   FINAL MICROSCOPIC DIAGNOSIS:   A. GALLBLADDER, CHOLECYSTECTOMY:  - Chronic cholecystitis.   Discharged Condition: good  Hospital Course: Alexander Foley is a 29 yo who presented to the hospital with intractable abdominal pain and nausea/ vomiting. He had been seen and discharged on ciprofloxacin earlier in the month for possible cholecystitis. He returned not having seen Dr. Lovell Sheehan yet in the clinic. He was taken back for a cholecystectomy and this was uneventful. Post operatively he did have some significant nausea and vomiting and was kept another night due to the inability to tolerate po.   The labs the day of discharge were reassuring. Xray done POD 1 was normal bowel gas pattern.   He still had some nausea but some of this was felt to be possible from anxiety per the RN.  He was tolerating a diet and having pain control prior to discharge. He was ambulating.   Consults: None - Hospitalist admission, Surgery took over   Significant Diagnostic Studies:  Results for Alexander Foley, Alexander Foley (MRN [id number]) as of 02/16/2020 11:54  Ref. Range 02/16/2020 05:58  Sodium Latest Ref Range: 135 - 145 mmol/L 137  Potassium Latest Ref Range: 3.5 - 5.1 mmol/L 3.5  Chloride Latest Ref Range: 98 - 111 mmol/L 101  CO2 Latest Ref Range: 22 - 32 mmol/L 26  Glucose Latest Ref Range: 70 - 99 mg/dL 91  BUN Latest Ref Range: 6 - 20 mg/dL 5 (L)  Creatinine Latest Ref Range: 0.61 - 1.24 mg/dL 6.28  Calcium Latest Ref Range: 8.9 - 10.3 mg/dL 8.8 (L)  Anion gap Latest Ref Range: 5 - 15  10  Alkaline Phosphatase Latest Ref Range: 38 - 126 U/L 47   Albumin Latest Ref Range: 3.5 - 5.0 g/dL 3.5  AST Latest Ref Range: 15 - 41 U/L 33  ALT Latest Ref Range: 0 - 44 U/L 33  Total Protein Latest Ref Range: 6.5 - 8.1 g/dL 6.1 (L)  Total Bilirubin Latest Ref Range: 0.3 - 1.2 mg/dL 1.0  GFR, Est Non African American Latest Ref Range: >60 mL/min >60  GFR, Est African American Latest Ref Range: >60 mL/min >60  WBC Latest Ref Range: 4.0 - 10.5 K/uL 7.5  RBC Latest Ref Range: 4.22 - 5.81 MIL/uL 4.67  Hemoglobin Latest Ref Range: 13.0 - 17.0 g/dL 31.5  HCT Latest Ref Range: 39 - 52 % 40.8  MCV Latest Ref Range: 80.0 - 100.0 fL 87.4  MCH Latest Ref Range: 26.0 - 34.0 pg 29.3  MCHC Latest Ref Range: 30.0 - 36.0 g/dL 17.6  RDW Latest Ref Range: 11.5 - 15.5 % 12.4  Platelets Latest Ref Range: 150 - 400 K/uL 284  nRBC Latest Ref Range: 0.0 - 0.2 % 0.0  Neutrophils Latest Units: % 74  Lymphocytes Latest Units: % 9  Monocytes Relative Latest Units: % 14  Eosinophil Latest Units: % 2  Basophil Latest Units: % 1  Immature Granulocytes Latest Units: % 0  NEUT# Latest Ref Range: 1.7 - 7.7 K/uL 5.6  Lymphocyte # Latest Ref Range: 0.7 - 4.0 K/uL 0.7  Monocyte # Latest Ref Range: 0 - 1 K/uL 1.1 (H)  Eosinophils Absolute Latest Ref Range: 0 -  0 K/uL 0.1  Basophils Absolute Latest Ref Range: 0 - 0 K/uL 0.0  Abs Immature Granulocytes Latest Ref Range: 0.00 - 0.07 K/uL 0.03   KUB Abdomen  CLINICAL DATA:  Status post cholecystectomy.  Nausea, vomiting  EXAM: ABDOMEN - 1 VIEW  COMPARISON:  CT 02/04/2020  FINDINGS: The subdiaphragmatic region is excluded from view. Visualized abdominal gas pattern is unremarkable. Cholecystectomy clips noted within the right upper quadrant. No gross free intraperitoneal gas. No organomegaly. No abnormal intra-abdominal calcification. Osseous structures are unremarkable.  IMPRESSION: Normal abdominal gas pattern   Electronically Signed   By: Helyn Numbers MD   On: 02/15/2020 09:49  Treatments: IV  hydration and surgery: 02/14/2020 Laparoscopic cholecystectomy   Discharge Exam: Blood pressure 124/84, pulse 84, temperature 98.6 F (37 C), temperature source Oral, resp. rate 18, height 5\' 9"  (1.753 m), weight 87.5 kg, SpO2 100 %. General appearance: alert, cooperative and no distress Resp: normal work of breathing GI: soft, nondistended, appropriately tender, port sites c/d/i with dermabond no erythema or drainage Extremities: extremities normal, atraumatic, no cyanosis or edema  Disposition: Discharge disposition: 01-Home or Self Care       Discharge Instructions    Call MD for:  difficulty breathing, headache or visual disturbances   Complete by: As directed    Call MD for:  extreme fatigue   Complete by: As directed    Call MD for:  persistant dizziness or light-headedness   Complete by: As directed    Call MD for:  persistant nausea and vomiting   Complete by: As directed    Call MD for:  redness, tenderness, or signs of infection (pain, swelling, redness, odor or green/yellow discharge around incision site)   Complete by: As directed    Call MD for:  severe uncontrolled pain   Complete by: As directed    Call MD for:  temperature >100.4   Complete by: As directed    Discharge instructions   Complete by: As directed    Please allow patient to walk multiple times a day. Increase activity slowly as tolerated.  Request for an extra mattress post operatively given the soreness from the incisions.   Increase activity slowly   Complete by: As directed    Lifting restrictions   Complete by: As directed    No heavy lifting > 10 lbs, excessive bending, pushing, pulling, or squatting for 1-2 weeks after surgery.   No dressing needed   Complete by: As directed    Ok to shower. Do not pick at the glue over the incisions.     Allergies as of 02/16/2020      Reactions   Tuna [fish Allergy] Shortness Of Breath, Swelling      Medication List    TAKE these medications    docusate sodium 100 MG capsule Commonly known as: Colace Take 1 capsule (100 mg total) by mouth 2 (two) times daily as needed for mild constipation.   ondansetron 4 MG disintegrating tablet Commonly known as: Zofran ODT Take 1 tablet (4 mg total) by mouth every 8 (eight) hours as needed for nausea or vomiting.   oxyCODONE 5 MG immediate release tablet Commonly known as: Oxy IR/ROXICODONE Take 1 tablet (5 mg total) by mouth every 4 (four) hours as needed for severe pain or breakthrough pain.            Discharge Care Instructions  (From admission, onward)         Start     Ordered  02/16/20 0000  No dressing needed       Comments: Ok to shower. Do not pick at the glue over the incisions.   02/16/20 1152          Follow-up Information    Lucretia Roers, MD On 03/04/2020.   Specialty: General Surgery Why: post op phone call from Dr. Henreitta Leber, if he needs to be seen in person call the office Contact information: [address] [place] [facility] [address] [phone]               Signed: Lucretia Roers 02/16/2020, 12:03 PM

## 2020-02-16 NOTE — Discharge Instructions (Signed)
Please allow the patient to walk and be mobile after surgery. Showering is ok. DO not pick at the glue on the incisions.  PLEASE GIVE PATIENT AN ADDITIONAL MATTRESS FOR 2 WEEKS TO HELP WITH SORENESS FROM HIS INCISIONS.   Discharge Laparoscopic Surgery Instructions:  Common Complaints: Right shoulder pain is common after laparoscopic surgery. This is secondary to the gas used in the surgery being trapped under the diaphragm.  Walk to help your body absorb the gas. This will improve in a few days. Pain at the port sites are common, especially the larger port sites. This will improve with time.  Some nausea is common and poor appetite. The main goal is to stay hydrated the first few days after surgery.   Diet/ Activity: Diet as tolerated. You may not have an appetite, but it is important to stay hydrated. Drink 64 ounces of water a day. Your appetite will return with time.  Shower per your regular routine daily.  Do not take hot showers. Take warm showers that are less than 10 minutes. Rest and listen to your body, but do not remain in bed all day.  Walk everyday for at least 15-20 minutes. Deep cough and move around every 1-2 hours in the first few days after surgery.  Do not lift > 10 lbs, perform excessive bending, pushing, pulling, squatting for 1-2 weeks after surgery.  Do not pick at the dermabond glue on your incision sites.  This glue film will remain in place for 1-2 weeks and will start to peel off.  Do not place lotions or balms on your incision unless instructed to specifically by Dr. Henreitta Leber.   Pain Expectations and Narcotics: -After surgery you will have pain associated with your incisions and this is normal. The pain is muscular and nerve pain, and will get better with time. -You are encouraged and expected to take non narcotic medications like tylenol and ibuprofen (when able) to treat pain as multiple modalities can aid with pain treatment. -Narcotics are only used when pain is  severe or there is breakthrough pain. -You are not expected to have a pain score of 0 after surgery, as we cannot prevent pain. A pain score of 3-4 that allows you to be functional, move, walk, and tolerate some activity is the goal. The pain will continue to improve over the days after surgery and is dependent on your surgery. -Due to  law, we are only able to give a certain amount of pain medication to treat post operative pain, and we only give additional narcotics on a patient by patient basis.  -For most laparoscopic surgery, studies have shown that the majority of patients only need 10-15 narcotic pills, and for open surgeries most patients only need 15-20.   -Having appropriate expectations of pain and knowledge of pain management with non narcotics is important as we do not want anyone to become addicted to narcotic pain medication.  -Using ice packs in the first 48 hours and heating pads after 48 hours, wearing an abdominal binder (when recommended), and using over the counter medications are all ways to help with pain management.   -Simple acts like meditation and mindfulness practices after surgery can also help with pain control and research has proven the benefit of these practices.  Medication: Take tylenol and ibuprofen as needed for pain control, alternating every 4-6 hours.  Example:  Tylenol 1000mg  @ 6am, 12noon, 6pm, (Do not exceed 4000mg  of tylenol a day). Ibuprofen 800mg  @ 9am, 3pm,  9pm, 3am (Do not exceed 3600mg  of ibuprofen a day).  Take Roxicodone for breakthrough pain every 4 hours.  Take Colace for constipation related to narcotic pain medication. If you do not have a bowel movement in 2 days, take Miralax over the counter.  Drink plenty of water to also prevent constipation.   Contact Information: If you have questions or concerns, please call our office, [phone], Monday- Thursday 8AM-5PM and Friday 8AM-12Noon.  If it is after hours or on the weekend,  please call [facility], [phone], and ask to speak to the surgeon on call for Dr. [phone] at [facility].    Laparoscopic Cholecystectomy, Care After This sheet gives you information about how to care for yourself after your procedure. Your doctor may also give you more specific instructions. If you have problems or questions, contact your doctor. Follow these instructions at home: Care for cuts from surgery (incisions)   Follow instructions from your doctor about how to take care of your cuts from surgery. Make sure you: ? Wash your hands with soap and water before you change your bandage (dressing). If you cannot use soap and water, use hand sanitizer. ? Change your bandage as told by your doctor. ? Leave stitches (sutures), skin glue, or skin tape (adhesive) strips in place. They may need to stay in place for 2 weeks or longer. If tape strips get loose and curl up, you may trim the loose edges. Do not remove tape strips completely unless your doctor says it is okay.  Do not take baths, swim, or use a hot tub until your doctor says it is okay.   You may shower.  Check your surgical cut area every day for signs of infection. Check for: ? More redness, swelling, or pain. ? More fluid or blood. ? Warmth. ? Pus or a bad smell. Activity  Do not drive or use heavy machinery while taking prescription pain medicine.  Do not lift anything that is heavier than 10 lb (4.5 kg) until your doctor says it is okay.  Do not play contact sports until your doctor says it is okay.  Do not drive for 24 hours if you were given a medicine to help you relax (sedative).  Rest as needed. Do not return to work or school until your doctor says it is okay. General instructions  Take over-the-counter and prescription medicines only as told by your doctor.  To prevent or treat constipation while you are taking prescription pain medicine, your doctor may recommend that you: ? Drink enough fluid  to keep your pee (urine) clear or pale yellow. ? Take over-the-counter or prescription medicines. ? Eat foods that are high in fiber, such as fresh fruits and vegetables, whole grains, and beans. ? Limit foods that are high in fat and processed sugars, such as fried and sweet foods. Contact a doctor if:  You develop a rash.  You have more redness, swelling, or pain around your surgical cuts.  You have more fluid or blood coming from your surgical cuts.  Your surgical cuts feel warm to the touch.  You have pus or a bad smell coming from your surgical cuts.  You have a fever.  One or more of your surgical cuts breaks open. Get help right away if:  You have trouble breathing.  You have chest pain.  You have pain that is getting worse in your shoulders.  You faint or feel dizzy when you stand.  You have very bad pain  in your belly (abdomen).  You are sick to your stomach (nauseous) for more than one day.  You have throwing up (vomiting) that lasts for more than one day.  You have leg pain. This information is not intended to replace advice given to you by your health care provider. Make sure you discuss any questions you have with your health care provider. Document Revised: 07/15/2017 Document Reviewed: 01/19/2016 Elsevier Patient Education  2020 [employer].

## 2020-02-16 NOTE — Progress Notes (Signed)
Attempted to call report to Prison at [phone] however I was informed that MD is required to call MD at the prison facility because patient due to be discharged on Oxycodone 5 mg IR q 4hrs as needed for severe pain or breakthrough pain. Informed by Nurse that current facility [facility] is not equipped with a nurse to administer pain medication to him at this time. Patient would need to be at another facility and the medical provider will make that decision. Dr. Henreitta Leber has been verbally  Informed.

## 2020-02-16 NOTE — Progress Notes (Signed)
   02/16/20 0430  Provider Notification  Provider Name/Title Dr. Henreitta Leber  Date Provider Notified 02/16/20  Time Provider Notified 0430  Notification Type Call  Notification Reason Other (Comment) (pt. is nauseous, vomitted x2, very anxious)  Response See new orders;Other (Comment) (phenergan 12.5, ativan mg 1mg )  Date of Provider Response 02/16/20  Time of Provider Response 0430

## 2020-02-16 NOTE — Progress Notes (Signed)
Report given to [place] at [facility].

## 2020-03-04 ENCOUNTER — Telehealth (INDEPENDENT_AMBULATORY_CARE_PROVIDER_SITE_OTHER): Payer: Self-pay | Admitting: General Surgery

## 2020-03-04 DIAGNOSIS — K8050 Calculus of bile duct without cholangitis or cholecystitis without obstruction: Secondary | ICD-10-CM

## 2020-03-04 NOTE — Progress Notes (Addendum)
[facility]  Post Op Non- Billable Phone Call   I am calling the patient for post operative evaluation due to the current COVID 19 pandemic. He is at [facility]. I called [phone] to see how he was doing.    I spoke with prison officials who reports that he is at the prison. They attempted to transfer me to speak with Dr. Merilynn Finland but the line did not connect. I gave the officials my office number for them to call me back at the office. Someone did call me back but did not wait for me to get to the phone. I called back [phone] and was sent to [employer] and I left a message for them to call the office with questions or concerns regarding the patient. I notified them we can fax the pathology report if needed. Nothing concerning- chronic cholecystitis.   I finally did speak with Dr. Merilynn Finland who reported patient without issues. Faxed pathology report to the prison.   Pathology: FINAL MICROSCOPIC DIAGNOSIS:   A. GALLBLADDER, CHOLECYSTECTOMY:  - Chronic cholecystitis.  Algis Greenhouse, MD [facility] [address] [place]ella Raring [place], [place] [address] [phone] (office)

## 2022-02-22 IMAGING — US US ABDOMEN LIMITED
1 series · 14 of 25 positions shown · non-contrast
Comparison: Renal sonogram of 11/29/2019

CLINICAL DATA: Epigastric pain for 1 day

EXAM:
ULTRASOUND ABDOMEN LIMITED RIGHT UPPER QUADRANT

[Series 1: us abdomen limited · 0.18mm/px · 14 of 75 slices shown]
[im 1/75]
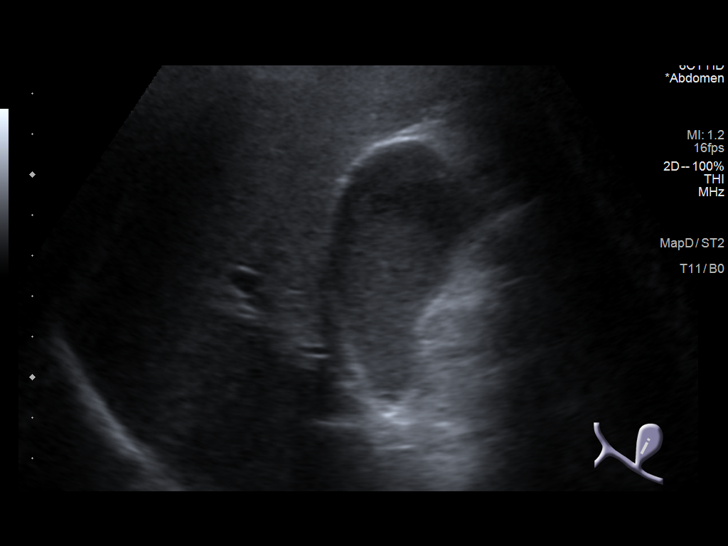
[im 7/75]
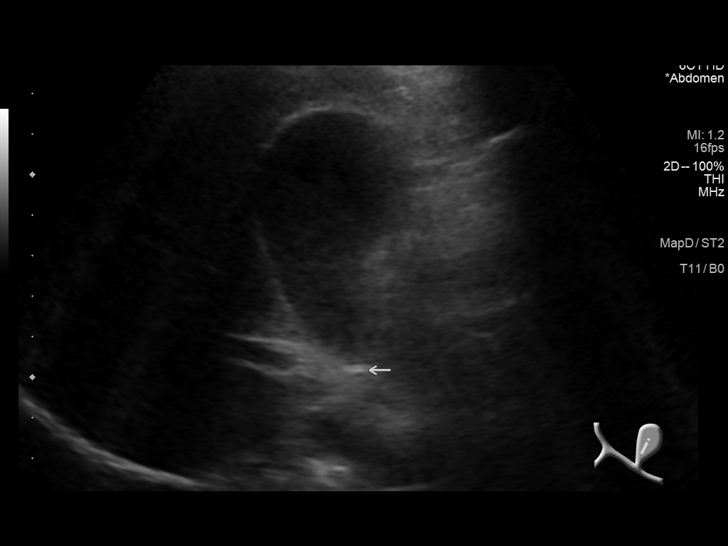
[im 13/75]
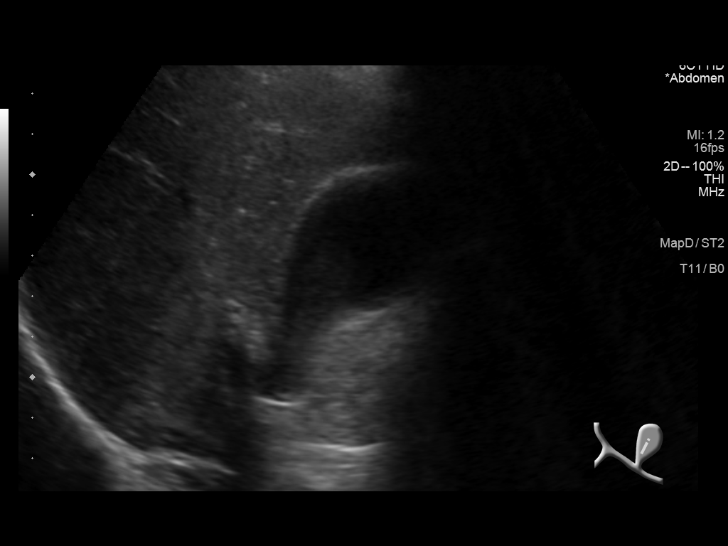
[im 19/75]
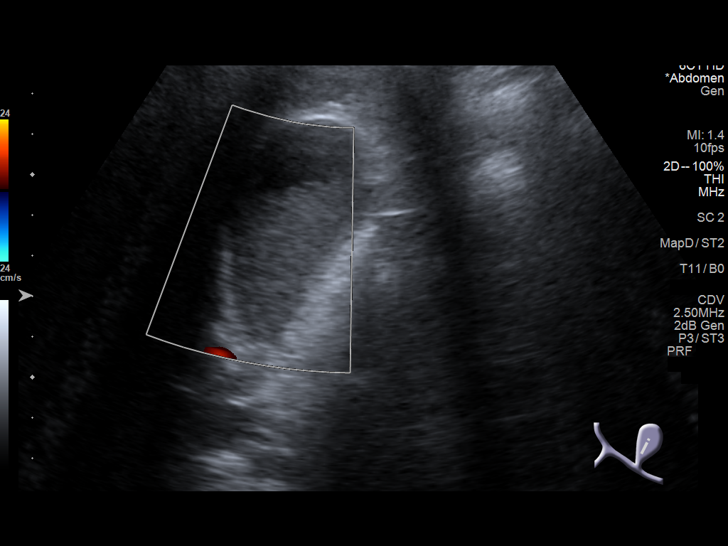
[im 25/75]
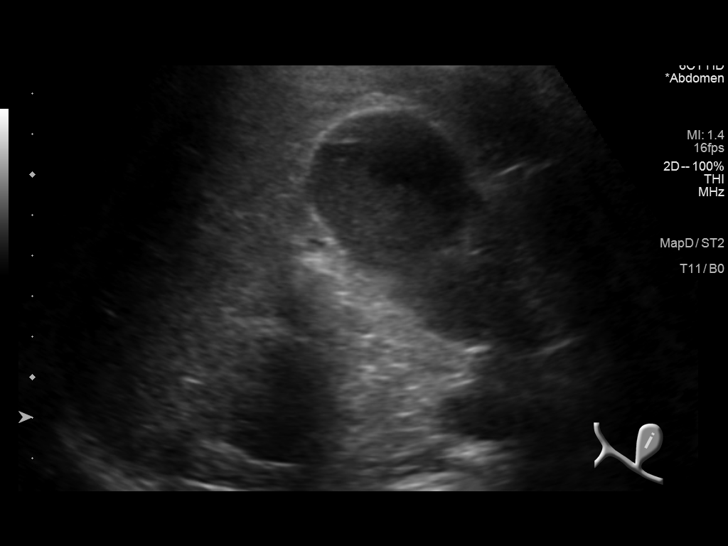
[im 28/75]
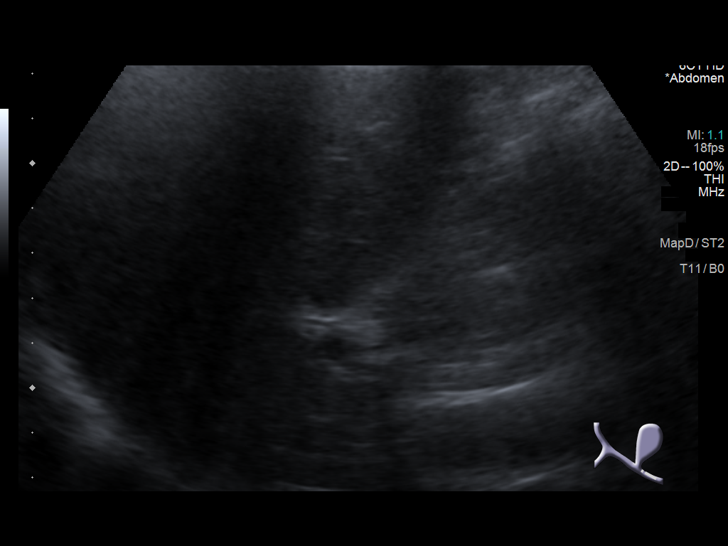
[im 34/75]
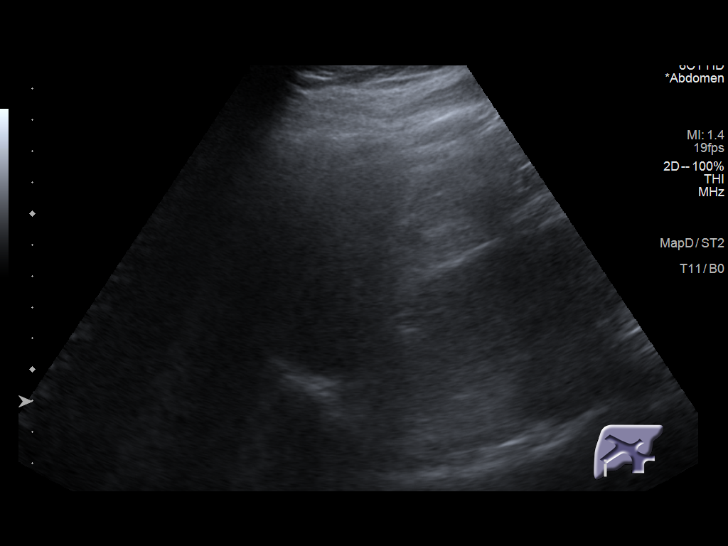
[im 41/75]
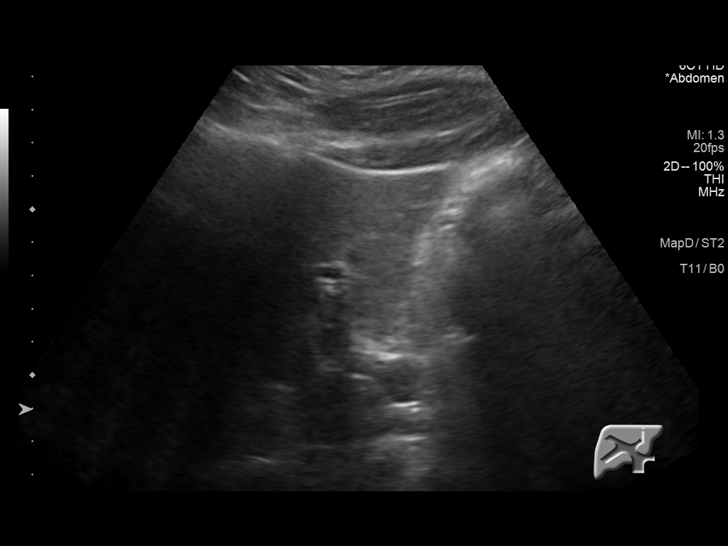
[im 47/75]
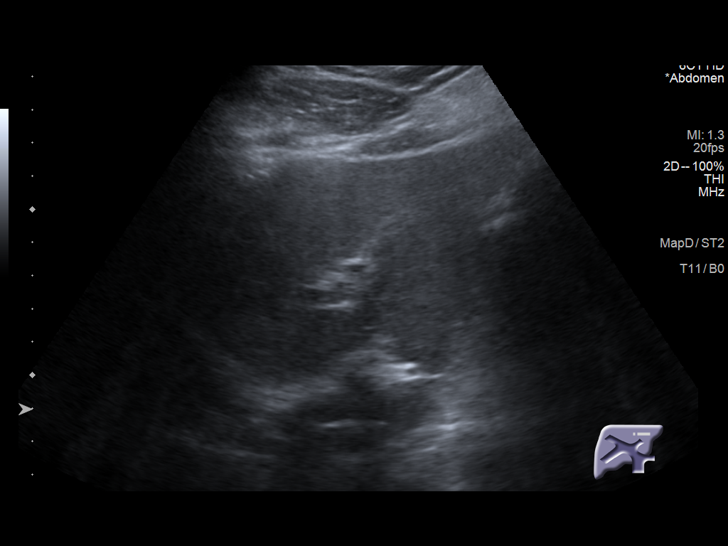
[im 50/75]
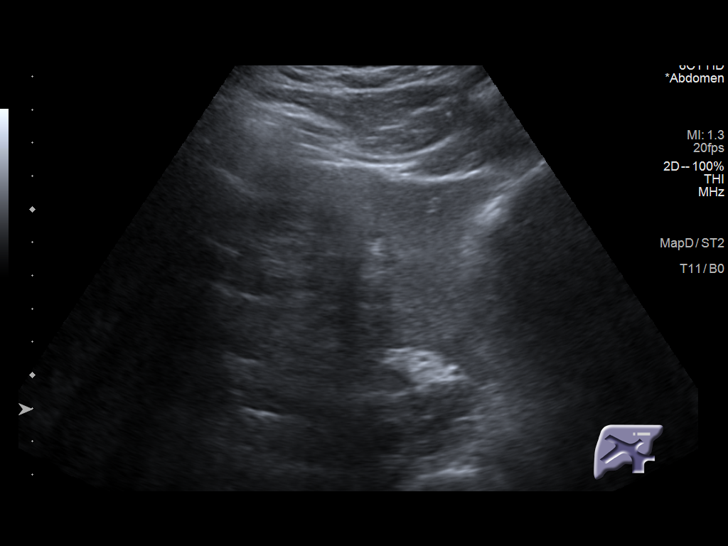
[im 56/75]
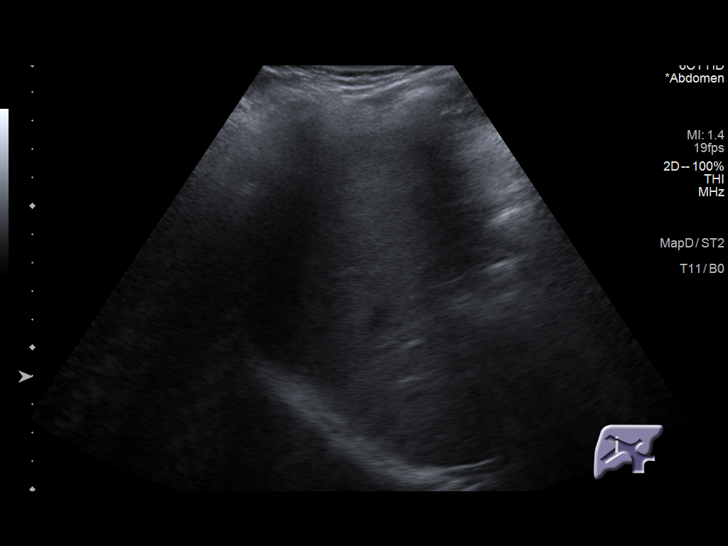
[im 62/75]
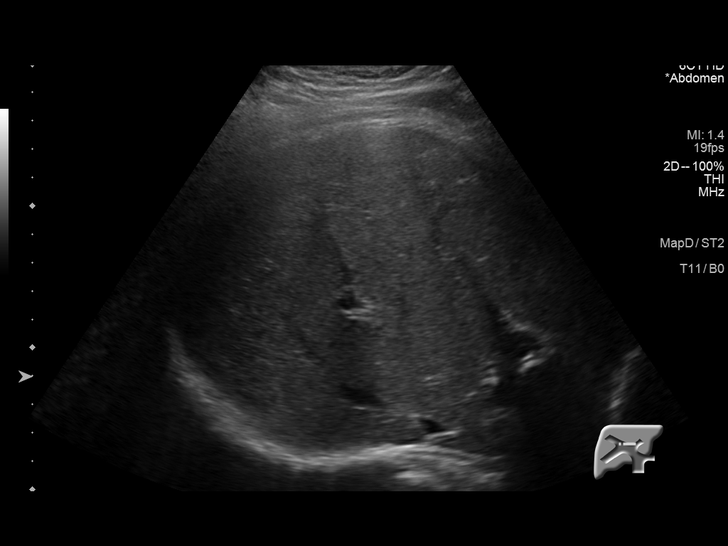
[im 68/75]
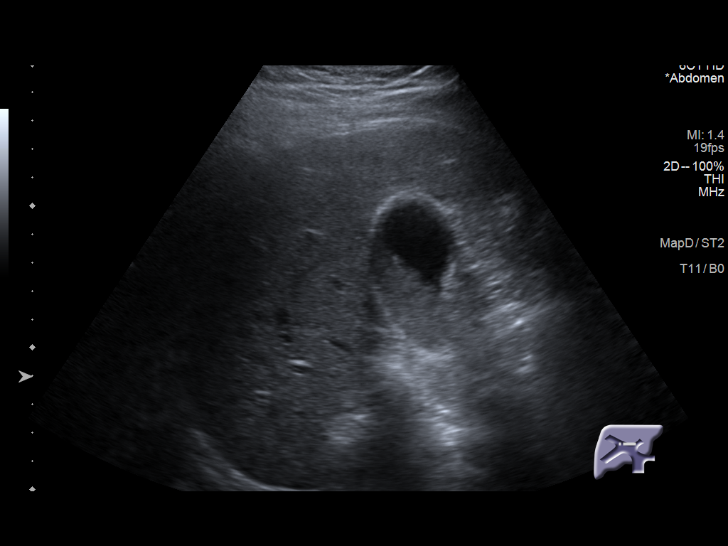
[im 75/75]
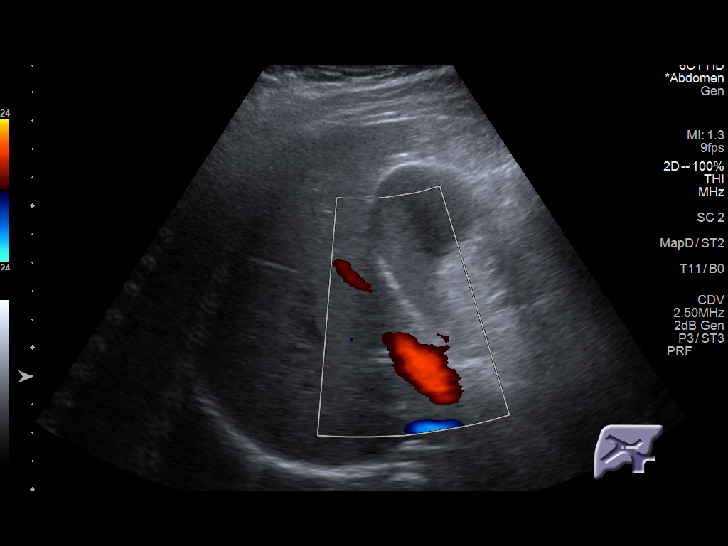

[14 of 25 positions shown; findings below may reference images not displayed]

FINDINGS: Gallbladder:

Sludge in the gallbladder. Small calculi are noted, largest 4 mm. No
reported tenderness over the gallbladder. No wall thickening or
pericholecystic fluid.

Common bile duct:

Diameter: 2.2 mm, limited assessment due to body habitus.

Liver:

No focal lesion identified. Within normal limits in parenchymal
echogenicity. Portal vein is patent on color Doppler imaging with
normal direction of blood flow towards the liver.

Other: None.
IMPRESSION: Sludge with small biliary calculus in the gallbladder lumen, no
sonographic signs of acute cholecystitis.
